# Patient Record
Sex: Male | Born: 1960 | Race: Black or African American | Hispanic: No | Smoking: Former smoker
Health system: Southern US, Community
[De-identification: ages and names within clinical notes are randomized; demographics above are authoritative.]

## PROBLEM LIST (undated history)

## (undated) DIAGNOSIS — J189 Pneumonia, unspecified organism: Secondary | ICD-10-CM

## (undated) DIAGNOSIS — R011 Cardiac murmur, unspecified: Secondary | ICD-10-CM

## (undated) DIAGNOSIS — M5126 Other intervertebral disc displacement, lumbar region: Secondary | ICD-10-CM

## (undated) DIAGNOSIS — E7800 Pure hypercholesterolemia, unspecified: Secondary | ICD-10-CM

## (undated) DIAGNOSIS — I639 Cerebral infarction, unspecified: Secondary | ICD-10-CM

## (undated) DIAGNOSIS — Z531 Procedure and treatment not carried out because of patient's decision for reasons of belief and group pressure: Secondary | ICD-10-CM

## (undated) DIAGNOSIS — E119 Type 2 diabetes mellitus without complications: Secondary | ICD-10-CM

## (undated) DIAGNOSIS — R0602 Shortness of breath: Secondary | ICD-10-CM

## (undated) DIAGNOSIS — T8859XA Other complications of anesthesia, initial encounter: Secondary | ICD-10-CM

## (undated) DIAGNOSIS — G8929 Other chronic pain: Secondary | ICD-10-CM

## (undated) DIAGNOSIS — M1990 Unspecified osteoarthritis, unspecified site: Secondary | ICD-10-CM

## (undated) DIAGNOSIS — IMO0001 Reserved for inherently not codable concepts without codable children: Secondary | ICD-10-CM

## (undated) DIAGNOSIS — K219 Gastro-esophageal reflux disease without esophagitis: Secondary | ICD-10-CM

## (undated) DIAGNOSIS — M545 Low back pain, unspecified: Secondary | ICD-10-CM

## (undated) DIAGNOSIS — I219 Acute myocardial infarction, unspecified: Secondary | ICD-10-CM

## (undated) DIAGNOSIS — T4145XA Adverse effect of unspecified anesthetic, initial encounter: Secondary | ICD-10-CM

---

## 1997-04-28 HISTORY — PX: FOOT SURGERY: SHX648

## 2001-04-28 DIAGNOSIS — I639 Cerebral infarction, unspecified: Secondary | ICD-10-CM

## 2001-04-28 DIAGNOSIS — I219 Acute myocardial infarction, unspecified: Secondary | ICD-10-CM

## 2001-04-28 HISTORY — DX: Acute myocardial infarction, unspecified: I21.9

## 2001-04-28 HISTORY — DX: Cerebral infarction, unspecified: I63.9

## 2003-09-10 ENCOUNTER — Other Ambulatory Visit: Payer: Self-pay

## 2003-12-15 ENCOUNTER — Other Ambulatory Visit: Payer: Self-pay

## 2004-02-12 ENCOUNTER — Ambulatory Visit: Payer: Self-pay

## 2004-05-30 ENCOUNTER — Emergency Department: Payer: Self-pay | Admitting: Emergency Medicine

## 2004-05-30 ENCOUNTER — Other Ambulatory Visit: Payer: Self-pay

## 2004-06-07 ENCOUNTER — Emergency Department: Payer: Self-pay | Admitting: General Practice

## 2004-06-28 ENCOUNTER — Other Ambulatory Visit: Payer: Self-pay

## 2004-06-28 ENCOUNTER — Emergency Department: Payer: Self-pay | Admitting: Emergency Medicine

## 2004-07-03 ENCOUNTER — Ambulatory Visit: Payer: Self-pay | Admitting: Anesthesiology

## 2004-08-15 ENCOUNTER — Ambulatory Visit: Payer: Self-pay | Admitting: Anesthesiology

## 2004-10-02 ENCOUNTER — Ambulatory Visit: Payer: Self-pay | Admitting: Anesthesiology

## 2004-11-14 ENCOUNTER — Ambulatory Visit: Payer: Self-pay | Admitting: Anesthesiology

## 2004-12-18 ENCOUNTER — Ambulatory Visit: Payer: Self-pay | Admitting: Anesthesiology

## 2004-12-22 ENCOUNTER — Other Ambulatory Visit: Payer: Self-pay

## 2004-12-22 ENCOUNTER — Inpatient Hospital Stay: Payer: Self-pay | Admitting: Internal Medicine

## 2005-01-13 ENCOUNTER — Ambulatory Visit: Payer: Self-pay

## 2005-01-15 ENCOUNTER — Ambulatory Visit: Payer: Self-pay | Admitting: Anesthesiology

## 2005-01-28 ENCOUNTER — Ambulatory Visit: Payer: Self-pay | Admitting: Anesthesiology

## 2005-03-11 ENCOUNTER — Ambulatory Visit: Payer: Self-pay | Admitting: Anesthesiology

## 2005-04-11 ENCOUNTER — Ambulatory Visit: Payer: Self-pay | Admitting: Anesthesiology

## 2005-05-30 ENCOUNTER — Ambulatory Visit: Payer: Self-pay | Admitting: Anesthesiology

## 2005-07-15 ENCOUNTER — Ambulatory Visit: Payer: Self-pay | Admitting: Anesthesiology

## 2005-11-02 ENCOUNTER — Emergency Department: Payer: Self-pay | Admitting: Emergency Medicine

## 2005-12-22 ENCOUNTER — Emergency Department: Payer: Self-pay | Admitting: Emergency Medicine

## 2005-12-22 ENCOUNTER — Other Ambulatory Visit: Payer: Self-pay

## 2010-07-22 ENCOUNTER — Ambulatory Visit: Payer: Self-pay | Admitting: Family Medicine

## 2013-02-25 ENCOUNTER — Encounter (HOSPITAL_COMMUNITY)
Admission: EM | Disposition: A | Discharge status: Home / Self Care | Payer: Self-pay | Source: Ambulatory Visit | Attending: Cardiology

## 2013-02-25 ENCOUNTER — Encounter (HOSPITAL_COMMUNITY): Payer: Self-pay | Admitting: General Practice

## 2013-02-25 ENCOUNTER — Ambulatory Visit (HOSPITAL_COMMUNITY)
Admission: EM | Admit: 2013-02-25 | Discharge: 2013-02-26 | Disposition: A | Discharge status: Home / Self Care | Payer: Medicaid Other | Source: Ambulatory Visit | Attending: Cardiology | Admitting: Cardiology

## 2013-02-25 DIAGNOSIS — R110 Nausea: Secondary | ICD-10-CM | POA: Insufficient documentation

## 2013-02-25 DIAGNOSIS — I10 Essential (primary) hypertension: Secondary | ICD-10-CM | POA: Insufficient documentation

## 2013-02-25 DIAGNOSIS — E7800 Pure hypercholesterolemia, unspecified: Secondary | ICD-10-CM | POA: Insufficient documentation

## 2013-02-25 DIAGNOSIS — E119 Type 2 diabetes mellitus without complications: Secondary | ICD-10-CM | POA: Insufficient documentation

## 2013-02-25 DIAGNOSIS — I252 Old myocardial infarction: Secondary | ICD-10-CM | POA: Insufficient documentation

## 2013-02-25 DIAGNOSIS — I2510 Atherosclerotic heart disease of native coronary artery without angina pectoris: Secondary | ICD-10-CM | POA: Insufficient documentation

## 2013-02-25 DIAGNOSIS — R079 Chest pain, unspecified: Secondary | ICD-10-CM | POA: Insufficient documentation

## 2013-02-25 DIAGNOSIS — Z23 Encounter for immunization: Secondary | ICD-10-CM | POA: Insufficient documentation

## 2013-02-25 DIAGNOSIS — Z8673 Personal history of transient ischemic attack (TIA), and cerebral infarction without residual deficits: Secondary | ICD-10-CM | POA: Insufficient documentation

## 2013-02-25 HISTORY — DX: Shortness of breath: R06.02

## 2013-02-25 HISTORY — DX: Cerebral infarction, unspecified: I63.9

## 2013-02-25 HISTORY — DX: Gastro-esophageal reflux disease without esophagitis: K21.9

## 2013-02-25 HISTORY — DX: Acute myocardial infarction, unspecified: I21.9

## 2013-02-25 HISTORY — PX: LEFT HEART CATHETERIZATION WITH CORONARY ANGIOGRAM: SHX5451

## 2013-02-25 HISTORY — PX: CARDIAC CATHETERIZATION: SHX172

## 2013-02-25 HISTORY — DX: Cardiac murmur, unspecified: R01.1

## 2013-02-25 LAB — T4, FREE: Free T4: 1.19 ng/dL (ref 0.80–1.80)

## 2013-02-25 LAB — TROPONIN I
Troponin I: 0.58 ng/mL (ref <0.30)
Troponin I: 0.53 ng/mL (ref <0.30)

## 2013-02-25 LAB — COMPREHENSIVE METABOLIC PANEL
ALT: 10 U/L (ref 0–53)
ALT: 11 U/L (ref 0–53)
AST: 13 U/L (ref 0–37)
AST: 13 U/L (ref 0–37)
Albumin: 3.5 g/dL (ref 3.5–5.2)
Albumin: 3.5 g/dL (ref 3.5–5.2)
Alkaline Phosphatase: 67 U/L (ref 39–117)
Alkaline Phosphatase: 70 U/L (ref 39–117)
BUN: 16 mg/dL (ref 6–23)
CO2: 23 mEq/L (ref 19–32)
Calcium: 9 mg/dL (ref 8.4–10.5)
Chloride: 102 mEq/L (ref 96–112)
Chloride: 104 mEq/L (ref 96–112)
GFR calc Af Amer: 78 mL/min — ABNORMAL LOW (ref >90)
GFR calc non Af Amer: 67 mL/min — ABNORMAL LOW (ref >90)
Glucose, Bld: 128 mg/dL — ABNORMAL HIGH (ref 70–99)
Glucose, Bld: 134 mg/dL — ABNORMAL HIGH (ref 70–99)
Potassium: 3.3 mEq/L — ABNORMAL LOW (ref 3.5–5.1)
Potassium: 3.4 mEq/L — ABNORMAL LOW (ref 3.5–5.1)
Sodium: 138 mEq/L (ref 135–145)
Total Bilirubin: 0.5 mg/dL (ref 0.3–1.2)
Total Bilirubin: 0.4 mg/dL (ref 0.3–1.2)

## 2013-02-25 LAB — CBC
HCT: 36.1 % — ABNORMAL LOW (ref 39.0–52.0)
Hemoglobin: 12.9 g/dL — ABNORMAL LOW (ref 13.0–17.0)
MCH: 29.3 pg (ref 26.0–34.0)
MCHC: 35.7 g/dL (ref 30.0–36.0)
WBC: 7.4 10*3/uL (ref 4.0–10.5)

## 2013-02-25 LAB — LIPID PANEL
HDL: 46 mg/dL (ref >39)
LDL Cholesterol: 86 mg/dL (ref 0–99)
Total CHOL/HDL Ratio: 3.4 RATIO
VLDL: 24 mg/dL (ref 0–40)

## 2013-02-25 LAB — CK TOTAL AND CKMB (NOT AT ARMC)
CK, MB: 1.8 ng/mL (ref 0.3–4.0)
Relative Index: 1.4 (ref 0.0–2.5)

## 2013-02-25 LAB — CBC WITH DIFFERENTIAL/PLATELET
Eosinophils Absolute: 0 10*3/uL (ref 0.0–0.7)
HCT: 37.8 % — ABNORMAL LOW (ref 39.0–52.0)
Hemoglobin: 13.6 g/dL (ref 13.0–17.0)
Lymphs Abs: 2 10*3/uL (ref 0.7–4.0)
MCH: 30 pg (ref 26.0–34.0)
MCHC: 36 g/dL (ref 30.0–36.0)
MCV: 83.4 fL (ref 78.0–100.0)
Monocytes Absolute: 0.5 10*3/uL (ref 0.1–1.0)
Monocytes Relative: 6 % (ref 3–12)
Neutro Abs: 5.4 10*3/uL (ref 1.7–7.7)
Neutrophils Relative %: 68 % (ref 43–77)
RBC: 4.53 MIL/uL (ref 4.22–5.81)
WBC: 7.9 10*3/uL (ref 4.0–10.5)

## 2013-02-25 LAB — PROTIME-INR
INR: 0.94 (ref 0.00–1.49)
Prothrombin Time: 13 seconds (ref 11.6–15.2)

## 2013-02-25 LAB — RAPID URINE DRUG SCREEN, HOSP PERFORMED
Amphetamines: NOT DETECTED
Barbiturates: NOT DETECTED
Opiates: NOT DETECTED
Tetrahydrocannabinol: NOT DETECTED

## 2013-02-25 LAB — GLUCOSE, CAPILLARY
Glucose-Capillary: 142 mg/dL — ABNORMAL HIGH (ref 70–99)
Glucose-Capillary: 76 mg/dL (ref 70–99)
Glucose-Capillary: 128 mg/dL — ABNORMAL HIGH (ref 70–99)

## 2013-02-25 LAB — HEMOGLOBIN A1C: Hgb A1c MFr Bld: 6.1 % — ABNORMAL HIGH (ref <5.7)

## 2013-02-25 LAB — APTT: aPTT: 25 seconds (ref 24–37)

## 2013-02-25 SURGERY — LEFT HEART CATHETERIZATION WITH CORONARY ANGIOGRAM
Anesthesia: LOCAL

## 2013-02-25 MED ORDER — ASPIRIN 81 MG PO CHEW: 324.0000 mg | CHEWABLE_TABLET | ORAL | Status: DC

## 2013-02-25 MED ORDER — NITROGLYCERIN 0.2 MG/ML ON CALL CATH LAB
INTRAVENOUS | Status: AC
  Filled 2013-02-25: qty 1

## 2013-02-25 MED ORDER — ONDANSETRON HCL 4 MG/2ML IJ SOLN: 4.0000 mg | Freq: Four times a day (QID) | INTRAMUSCULAR | Status: DC | PRN

## 2013-02-25 MED ORDER — MIDAZOLAM HCL 2 MG/2ML IJ SOLN
INTRAMUSCULAR | Status: AC
  Filled 2013-02-25: qty 2

## 2013-02-25 MED ORDER — NITROGLYCERIN 0.4 MG SL SUBL: 0.4000 mg | SUBLINGUAL_TABLET | SUBLINGUAL | Status: DC | PRN

## 2013-02-25 MED ORDER — ASPIRIN EC 81 MG PO TBEC
81.0000 mg | DELAYED_RELEASE_TABLET | Freq: Every day | ORAL | Status: DC
  Administered 2013-02-26: 09:00:00 81 mg via ORAL
  Filled 2013-02-25: qty 1

## 2013-02-25 MED ORDER — FENTANYL CITRATE 0.05 MG/ML IJ SOLN
INTRAMUSCULAR | Status: AC
  Filled 2013-02-25: qty 2

## 2013-02-25 MED ORDER — METOPROLOL TARTRATE 12.5 MG HALF TABLET
12.5000 mg | ORAL_TABLET | Freq: Two times a day (BID) | ORAL | Status: DC
  Administered 2013-02-25 – 2013-02-26 (×2): 12.5 mg via ORAL
  Filled 2013-02-25 (×4): qty 1

## 2013-02-25 MED ORDER — INSULIN ASPART 100 UNIT/ML ~~LOC~~ SOLN: 0.0000 [IU] | Freq: Three times a day (TID) | SUBCUTANEOUS | Status: DC

## 2013-02-25 MED ORDER — ASPIRIN 300 MG RE SUPP
300.0000 mg | RECTAL | Status: DC
  Filled 2013-02-25: qty 1

## 2013-02-25 MED ORDER — INFLUENZA VAC SPLIT QUAD 0.5 ML IM SUSP
0.5000 mL | INTRAMUSCULAR | Status: AC
  Administered 2013-02-25: 0.5 mL via INTRAMUSCULAR
  Filled 2013-02-25: qty 0.5

## 2013-02-25 MED ORDER — HEPARIN (PORCINE) IN NACL 2-0.9 UNIT/ML-% IJ SOLN
INTRAMUSCULAR | Status: AC
  Filled 2013-02-25: qty 1000

## 2013-02-25 MED ORDER — ACETAMINOPHEN 325 MG PO TABS
650.0000 mg | ORAL_TABLET | ORAL | Status: DC | PRN
  Administered 2013-02-25: 650 mg via ORAL

## 2013-02-25 MED ORDER — SODIUM CHLORIDE 0.9 % IV SOLN
INTRAVENOUS | Status: AC
  Administered 2013-02-25: 12:00:00 via INTRAVENOUS

## 2013-02-25 MED ORDER — POTASSIUM CHLORIDE CRYS ER 20 MEQ PO TBCR
40.0000 meq | EXTENDED_RELEASE_TABLET | Freq: Once | ORAL | Status: AC
  Administered 2013-02-25: 18:00:00 40 meq via ORAL
  Filled 2013-02-25: qty 2

## 2013-02-25 MED ORDER — ATORVASTATIN CALCIUM 80 MG PO TABS
80.0000 mg | ORAL_TABLET | Freq: Every day | ORAL | Status: DC
  Filled 2013-02-25 (×2): qty 1

## 2013-02-25 MED ORDER — LISINOPRIL 5 MG PO TABS
5.0000 mg | ORAL_TABLET | Freq: Every day | ORAL | Status: DC
  Administered 2013-02-25 – 2013-02-26 (×2): 5 mg via ORAL
  Filled 2013-02-25 (×2): qty 1

## 2013-02-25 MED ORDER — ACETAMINOPHEN 325 MG PO TABS
650.0000 mg | ORAL_TABLET | ORAL | Status: DC | PRN
  Filled 2013-02-25: qty 2

## 2013-02-25 MED ORDER — LIDOCAINE HCL (PF) 1 % IJ SOLN
INTRAMUSCULAR | Status: AC
  Filled 2013-02-25: qty 30

## 2013-02-25 MED ORDER — OXYCODONE-ACETAMINOPHEN 5-325 MG PO TABS: 1.0000 | ORAL_TABLET | ORAL | Status: DC | PRN

## 2013-02-25 NOTE — Progress Notes (Signed)
Chaplain paged to ED for Code Stemi. Patient was taken directly to the Cath Lab per ED. No family members present.   02/25/13 1117  Clinical Encounter Type  Visited With Health care provider  Visit Type Initial;Code;ED  Referral From Nurse

## 2013-02-25 NOTE — H&P (Signed)
Ethan Rosales is an 52 y.o. male.   Chief Complaint: Chest pain HPI: Patient is 52 year old male with past medical history significant for coronary artery disease history of small MI in the past as per patient, history of CVA in 2003, non-insulin-dependent diabetes mellitus, hypercholesteremia, came to [facility] Cath Lab by [facility] as code STEMI  was called. Patient states while at work developed sudden onset of chest pain described as pressure grade 10 over 10 radiating to right arm jaw associated with nausea diaphoresis EKG done on the field showed the normal sinus rhythm with early R-wave progression in anterior leads with early repolarization and minor ST-T wave changes in lateral leads. Patient received aspirin and nitroglycerin with relief of chest pain from 10 over 10 to 1/10. Patient denies any palpitation lightheadedness or syncope. Denies any PND orthopnea leg swelling. Patient states he had cardiac cath done at [facility] and at [facility] and was told to have a small blockages.  No past medical history on file.  No past surgical history on file.  No family history on file. Social History:  has no tobacco, alcohol, and drug history on file.  Allergies:  Allergies  Allergen Reactions  . Morphine And Related   . Penicillins     No prescriptions prior to admission    No results found for this or any previous visit (from the past 48 hour(s)). No results found.  Review of Systems  Constitutional: Negative for fever and chills.  HENT: Negative for hearing loss.   Eyes: Negative for blurred vision and double vision.  Respiratory: Negative for cough, hemoptysis, sputum production and shortness of breath.   Cardiovascular: Positive for chest pain. Negative for palpitations, orthopnea, claudication and leg swelling.  Gastrointestinal: Positive for nausea. Negative for vomiting, abdominal pain and diarrhea.  Genitourinary: Negative for dysuria and urgency.   Neurological: Negative for dizziness and headaches.    Height 6\' 3"  (1.905 m), weight 114.7 kg (252 lb 13.9 oz). Physical Exam  Constitutional: He is oriented to person, place, and time. He appears well-developed and well-nourished.  HENT:  Head: Normocephalic and atraumatic.  Eyes: Left eye exhibits no discharge. No scleral icterus.  Neck: Normal range of motion. Neck supple. No JVD present. No tracheal deviation present. No thyromegaly present.  Cardiovascular: Normal rate, regular rhythm and normal heart sounds.   No murmur heard. Respiratory: Effort normal and breath sounds normal. No respiratory distress. He has no wheezes. He has no rales.  GI: Soft. Bowel sounds are normal. He exhibits no distension.  Musculoskeletal: He exhibits no edema and no tenderness.  Neurological: He is alert and oriented to person, place, and time.     Assessment/Plan Chest pain with minor EKG changes rule out MI Coronary artery disease history of MI in the past Non-insulin-dependent diabetes mellitus History of CVA Hypercholesteremia Plan Discussed with patient briefly in the Cath Lab regarding left cath possible PTCA stenting its risk and benefits and consented for PCI  [facility] N 02/25/2013, 11:59 AM

## 2013-02-25 NOTE — CV Procedure (Signed)
Left cardiac cath report dictated on 02/25/2013 dictation number is [phone]

## 2013-02-26 LAB — LIPID PANEL
HDL: 46 mg/dL (ref >39)
Total CHOL/HDL Ratio: 3.5 RATIO

## 2013-02-26 LAB — CBC
HCT: 38.2 % — ABNORMAL LOW (ref 39.0–52.0)
Hemoglobin: 13.4 g/dL (ref 13.0–17.0)
MCV: 83.8 fL (ref 78.0–100.0)
Platelets: 191 10*3/uL (ref 150–400)
RBC: 4.56 MIL/uL (ref 4.22–5.81)
WBC: 6.6 10*3/uL (ref 4.0–10.5)

## 2013-02-26 LAB — TROPONIN I
Troponin I: <0.3 ng/mL (ref <0.30)
Troponin I: 0.56 ng/mL (ref <0.30)

## 2013-02-26 LAB — BASIC METABOLIC PANEL
BUN: 13 mg/dL (ref 6–23)
CO2: 20 mEq/L (ref 19–32)
Chloride: 105 mEq/L (ref 96–112)
Creatinine, Ser: 1.21 mg/dL (ref 0.50–1.35)
GFR calc non Af Amer: 68 mL/min — ABNORMAL LOW (ref >90)
Glucose, Bld: 93 mg/dL (ref 70–99)

## 2013-02-26 LAB — GLUCOSE, CAPILLARY: Glucose-Capillary: 102 mg/dL — ABNORMAL HIGH (ref 70–99)

## 2013-02-26 MED ORDER — LISINOPRIL 5 MG PO TABS: 5.0000 mg | ORAL_TABLET | Freq: Every day | ORAL | Status: DC

## 2013-02-26 MED ORDER — METOPROLOL TARTRATE 12.5 MG HALF TABLET: 12.5000 mg | ORAL_TABLET | Freq: Two times a day (BID) | ORAL | Status: DC

## 2013-02-26 MED ORDER — NITROGLYCERIN 0.4 MG SL SUBL: 0.4000 mg | SUBLINGUAL_TABLET | SUBLINGUAL | Status: DC | PRN

## 2013-02-26 MED ORDER — METFORMIN HCL 500 MG PO TABS: 2000.0000 mg | ORAL_TABLET | Freq: Every day | ORAL | Status: DC

## 2013-02-26 MED ORDER — LOVASTATIN 40 MG PO TABS: 40.0000 mg | ORAL_TABLET | Freq: Every day | ORAL | Status: DC

## 2013-02-26 NOTE — Discharge Summary (Signed)
  Discharge summary dictated on 02/26/2013 dictation number is [phone]

## 2013-02-26 NOTE — Cardiovascular Report (Signed)
Ethan Rosales, Ethan Rosales             ACCOUNT NO.:  [id number]  MEDICAL RECORD NO.:  [id number]  LOCATION:  6C07C                        FACILITY:  [facility]  PHYSICIAN:  Kamarii Buren N. Sharyn Lull, M.D. DATE OF BIRTH:  03/16/1961  DATE OF PROCEDURE:  02/25/2013 DATE OF DISCHARGE:                           CARDIAC CATHETERIZATION   PROCEDURE:  Left cardiac cath with selective left and right coronary angiography, left ventriculography via right groin using Judkins technique.  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  He came to the [facility] Cath Lab at [facility] as code STEMI was called.  The patient states while at work he developed sudden onset of chest pain described as pressure, grade 10/10, radiating to right arm and jaw associated with nausea and diaphoresis.  EKG done on the field showed normal sinus rhythm with early R-wave progression in the anterior leads with early repolarization changes and minor ST-T wave changes in lateral leads.  The patient received aspirin, sublingual nitro with partial relief of chest pain. Denies any palpitation lightheadedness, or syncope.  Denies PND, orthopnea, or leg swelling.  Denies any history of exertional chest pain.  Denies any drug abuse.  The patient states he had cardiac cath in the past at [facility] and was told to have small blockages.  The patient was discussed about minor EKG changes in the cath lab and left cath, possible PTCA stenting, its risks and benefits, i.e., death, MI, stroke, need for emergency CABG, local vascular complications, and consented for the procedure.  PROCEDURE:  After obtaining the informed consent.  The patient was already in the cath  lab.  Right groin was prepped and draped in usual fashion.  Xylocaine 1% was used for local anesthesia in the right groin. With the help of thin-wall needle, a 6-French arterial sheath was placed.  The sheath was aspirated and flushed.  Next, 6-French left Judkins catheter was advanced over the wire under fluoroscopic guidance up to the ascending aorta.  Wire was pulled out.  The catheter was aspirated and connected to the Manifold.  Catheter was further advanced and engaged into left coronary ostium.  Multiple views of the left system were taken.  Next, catheter was disengaged and was pulled out over the wire and was replaced with 6-French right Judkins catheter, which was advanced over the wire under fluoroscopic guidance up to the ascending aorta.  Wire was pulled out, the catheter was aspirated, and connected to the Manifold.  Catheter was further advanced and engaged into right coronary ostium.  Multiple views of the right system were taken.  Next, catheter was disengaged and was pulled out over the wire and was replaced with 6-French pigtail catheter, which was advanced over the wire under fluoroscopic guidance up to the ascending aorta.  Wire was pulled out, the catheter was aspirated, and connected to the Manifold.  Catheter was further advanced across the aortic valve into the LV.  LV pressures were recorded.  Next, LV graph  was done in 30- degree RAO position.  Post-angiographic pressures were recorded from LV and then pullback pressures were recorded from the aorta.  There was no gradient across the aortic valve.  Next, pigtail catheter was pulled out over the wire.  Sheaths were aspirated and flushed.  FINDINGS:  LV showed good LV systolic function, mild LVH, EF of 60% to 65%.  Left main was patent.  LAD was patent.  Diagonal 1 and 2 were small, which were patent.  Left circumflex was large, which was patent. OM1 and OM2 were small, which were patent.  OM3 and OM4 were  moderate size, which were patent.  RCA was patent.  The patient has left dominant system.  The patient tolerated the procedure well.  There were no complications.  The patient was transferred to recovery room in stable condition.     Eduardo Osier. Sharyn Lull, M.D.     MNH/MEDQ  D:  02/25/2013  T:  02/26/2013  Job:  [id number]

## 2013-02-26 NOTE — Discharge Summary (Signed)
Ethan Rosales, Ethan Rosales             ACCOUNT NO.:  [id number]  MEDICAL RECORD NO.:  [id number]  LOCATION:  6C07C                        FACILITY:  [facility]  PHYSICIAN:  Ethan Rosales, M.D. DATE OF BIRTH:  Sep 20, 1960  DATE OF ADMISSION:  02/25/2013 DATE OF DISCHARGE:  02/26/2013                              DISCHARGE SUMMARY   ADMITTING DIAGNOSES: 1. Chest pain with minor EKG changes rule out myocardial infarction. 2. Coronary artery disease, history of myocardial infarction in the     past. 3. Non-insulin-dependent diabetes mellitus. 4. History of cerebrovascular accident. 5. Hypercholesteremia.  DISCHARGE DIAGNOSES: 1. Status post acute coronary syndrome, status post left cardiac     catheterization. 2. Coronary artery disease, history of myocardial infarction in the     past. 3. Non-insulin-dependent diabetes mellitus. 4. Hypertension. 5. History of cerebrovascular accident. 6. Hypercholesteremia.  DISCHARGE HOME MEDICATIONS: 1. Lisinopril 5 mg 1 tablet daily. 2. Metoprolol tartrate 12.5 mg twice daily. 3. Nitrostat 0.4 mg sublingually use as directed. 4. Lovastatin 40 mg 1 tablet daily. 5. Aspirin 81 mg 1 tablet daily. 6. Metformin 1000 mg twice daily as before starting from February 28, 2013. 7. Omeprazole 40 mg daily. 8. Travatan eyedrops as before.  DIET:  Low-salt, low-cholesterol 1800 calories ADA diet.  The patient has been advised to monitor blood pressure and blood sugar daily. Follow up with me in 1 week.  CONDITION AT DISCHARGE:  Stable.  BRIEF HISTORY AND HOSPITAL COURSE:  [name] is a 52 year old male with past medical history significant for coronary artery disease, history of small myocardial infarction in the past as per the patient, history of cerebrovascular accident in 2003, non-insulin-dependent diabetes mellitus, hypercholesteremia.  He came to [facility] Cath Lab by [facility] as code STEMI was called.  The patient  states while at work, he developed sudden onset of chest pain described as pressure, grade 10/10 radiating to right arm, jaw associated with nausea and diaphoresis.  EKG done on the field showed normal sinus rhythm with early R-wave progression in anterior leads with early repolarization changes and minor ST-T wave changes in lateral leads.  The patient received aspirin and nitroglycerin with relief of chest pain from 10/10 to 1/10 when seen in the cath lab.  The patient denies any palpitation, lightheadedness, or syncope.  Denies PND, orthopnea, or leg swelling. States he had cardiac cath done at [facility] and at [facility] in the past and was told to have small blockages.  The patient did not require any PCI.  Old records are not available.  PHYSICAL EXAMINATION:  GENERAL:  He was alert, awake, oriented x3, in no acute distress.  He was hemodynamically stable. EYES:  Conjunctivae was pink. NECK:  Supple.  No JVD.  No bruit. LUNGS:  Clear to auscultation without rhonchi or rales. CARDIOVASCULAR:  S1, S2 was normal.  There was no S3, gallop.  No murmur or S4 gallop. ABDOMEN:  Soft.  Bowel sounds were present.  Nontender. EXTREMITIES:  There was no clubbing, cyanosis, or edema.  LABORATORY DATA:  Sodium was 137, potassium 3.3.  Repeat potassium this morning 3.7, glucose was 128, BUN 16, creatinine 1.26.  His  first set of troponin-I was 0.58, total CK was normal at 131, MB 1.8.  Repeat troponin-I was 0.53.  Next 2 sets were less than 0.30, this morning it is 0.56 which is minimally elevated.  His cholesterol was 156, triglycerides 116, HDL 46, LDL was 86.  Hemoglobin was 12.9, hematocrit 36.1, white count of 7.4.  The urine drug screen was negative. Hemoglobin A1c was 6.1.  BRIEF HOSPITAL COURSE:  The patient underwent emergent left cardiac cath with selective left and right coronary angiography, and LV graphy as per procedure report.  The patient tolerated the procedure well.   There were no complications.  The patient did not had any significant angiographic stenosis.  Post procedure, the patient did not had any anginal chest pain, complains of occasional GERD symptoms.  His groin is stable with no evidence of hematoma or bruit.  The patient is ambulating in hallway without any problems.  The patient will be discharged home on above medications and will be followed up in my office in 1 week.     Ethan Rosales. Sharyn Rosales, M.D.     MNH/MEDQ  D:  02/26/2013  T:  02/26/2013  Job:  [id number]

## 2013-04-14 ENCOUNTER — Encounter (HOSPITAL_COMMUNITY): Payer: Self-pay | Admitting: Emergency Medicine

## 2013-04-14 ENCOUNTER — Emergency Department (HOSPITAL_COMMUNITY)
Admission: EM | Admit: 2013-04-14 | Discharge: 2013-04-14 | Disposition: A | Discharge status: Home / Self Care | Payer: No Typology Code available for payment source | Attending: Emergency Medicine | Admitting: Emergency Medicine

## 2013-04-14 DIAGNOSIS — R011 Cardiac murmur, unspecified: Secondary | ICD-10-CM | POA: Insufficient documentation

## 2013-04-14 DIAGNOSIS — Y92410 Unspecified street and highway as the place of occurrence of the external cause: Secondary | ICD-10-CM | POA: Insufficient documentation

## 2013-04-14 DIAGNOSIS — Z880 Allergy status to penicillin: Secondary | ICD-10-CM | POA: Diagnosis not present

## 2013-04-14 DIAGNOSIS — Z95818 Presence of other cardiac implants and grafts: Secondary | ICD-10-CM | POA: Insufficient documentation

## 2013-04-14 DIAGNOSIS — Z79899 Other long term (current) drug therapy: Secondary | ICD-10-CM | POA: Diagnosis not present

## 2013-04-14 DIAGNOSIS — Z87891 Personal history of nicotine dependence: Secondary | ICD-10-CM | POA: Diagnosis not present

## 2013-04-14 DIAGNOSIS — S0990XA Unspecified injury of head, initial encounter: Secondary | ICD-10-CM | POA: Diagnosis present

## 2013-04-14 DIAGNOSIS — Z8673 Personal history of transient ischemic attack (TIA), and cerebral infarction without residual deficits: Secondary | ICD-10-CM | POA: Diagnosis not present

## 2013-04-14 DIAGNOSIS — E119 Type 2 diabetes mellitus without complications: Secondary | ICD-10-CM | POA: Diagnosis not present

## 2013-04-14 DIAGNOSIS — K219 Gastro-esophageal reflux disease without esophagitis: Secondary | ICD-10-CM | POA: Diagnosis not present

## 2013-04-14 DIAGNOSIS — M542 Cervicalgia: Secondary | ICD-10-CM

## 2013-04-14 DIAGNOSIS — Z7982 Long term (current) use of aspirin: Secondary | ICD-10-CM | POA: Diagnosis not present

## 2013-04-14 DIAGNOSIS — S0993XA Unspecified injury of face, initial encounter: Secondary | ICD-10-CM | POA: Insufficient documentation

## 2013-04-14 DIAGNOSIS — I252 Old myocardial infarction: Secondary | ICD-10-CM | POA: Insufficient documentation

## 2013-04-14 DIAGNOSIS — Y9389 Activity, other specified: Secondary | ICD-10-CM | POA: Diagnosis not present

## 2013-04-14 MED ORDER — IBUPROFEN 800 MG PO TABS: 800.0000 mg | ORAL_TABLET | Freq: Three times a day (TID) | ORAL | Status: DC

## 2013-04-14 MED ORDER — METHOCARBAMOL 500 MG PO TABS: 500.0000 mg | ORAL_TABLET | Freq: Two times a day (BID) | ORAL | Status: DC | PRN

## 2013-04-14 MED ORDER — IBUPROFEN 400 MG PO TABS
800.0000 mg | ORAL_TABLET | Freq: Once | ORAL | Status: AC
  Administered 2013-04-14: 800 mg via ORAL
  Filled 2013-04-14: qty 2
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
## 2013-04-16 NOTE — ED Provider Notes (Signed)
Medical screening examination/treatment/procedure(s) were performed by non-physician practitioner and as supervising physician I was immediately available for consultation/collaboration.  EKG Interpretation   None         [name]. Sheddrick Lattanzio, MD 04/16/13 1630

## 2013-12-10 ENCOUNTER — Encounter (HOSPITAL_COMMUNITY): Payer: Self-pay | Admitting: Emergency Medicine

## 2013-12-10 ENCOUNTER — Emergency Department (HOSPITAL_COMMUNITY)
Admission: EM | Admit: 2013-12-10 | Discharge: 2013-12-10 | Disposition: A | Discharge status: Home / Self Care | Payer: Commercial Managed Care - PPO | Attending: Emergency Medicine | Admitting: Emergency Medicine

## 2013-12-10 ENCOUNTER — Emergency Department (HOSPITAL_COMMUNITY): Payer: Commercial Managed Care - PPO

## 2013-12-10 DIAGNOSIS — M545 Low back pain, unspecified: Secondary | ICD-10-CM

## 2013-12-10 DIAGNOSIS — S0993XA Unspecified injury of face, initial encounter: Secondary | ICD-10-CM | POA: Diagnosis not present

## 2013-12-10 DIAGNOSIS — Y9389 Activity, other specified: Secondary | ICD-10-CM | POA: Diagnosis not present

## 2013-12-10 DIAGNOSIS — Z79899 Other long term (current) drug therapy: Secondary | ICD-10-CM | POA: Diagnosis not present

## 2013-12-10 DIAGNOSIS — S199XXA Unspecified injury of neck, initial encounter: Secondary | ICD-10-CM | POA: Diagnosis present

## 2013-12-10 DIAGNOSIS — Y92410 Unspecified street and highway as the place of occurrence of the external cause: Secondary | ICD-10-CM | POA: Insufficient documentation

## 2013-12-10 DIAGNOSIS — K219 Gastro-esophageal reflux disease without esophagitis: Secondary | ICD-10-CM | POA: Diagnosis not present

## 2013-12-10 DIAGNOSIS — E7800 Pure hypercholesterolemia, unspecified: Secondary | ICD-10-CM | POA: Diagnosis not present

## 2013-12-10 DIAGNOSIS — Z7982 Long term (current) use of aspirin: Secondary | ICD-10-CM | POA: Diagnosis not present

## 2013-12-10 DIAGNOSIS — IMO0002 Reserved for concepts with insufficient information to code with codable children: Secondary | ICD-10-CM | POA: Diagnosis not present

## 2013-12-10 DIAGNOSIS — Z880 Allergy status to penicillin: Secondary | ICD-10-CM | POA: Insufficient documentation

## 2013-12-10 DIAGNOSIS — M542 Cervicalgia: Secondary | ICD-10-CM

## 2013-12-10 HISTORY — DX: Pure hypercholesterolemia, unspecified: E78.00

## 2013-12-10 HISTORY — DX: Gastro-esophageal reflux disease without esophagitis: K21.9

## 2013-12-10 MED ORDER — CYCLOBENZAPRINE HCL 5 MG PO TABS: 10.0000 mg | ORAL_TABLET | Freq: Two times a day (BID) | ORAL | Status: DC | PRN

## 2013-12-10 MED ORDER — HYDROCODONE-ACETAMINOPHEN 5-325 MG PO TABS: 1.0000 | ORAL_TABLET | Freq: Four times a day (QID) | ORAL | Status: DC | PRN

## 2013-12-10 NOTE — ED Notes (Signed)
Pt remains in Xray  
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
## 2013-12-10 NOTE — Discharge Instructions (Signed)
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
## 2013-12-11 NOTE — ED Provider Notes (Signed)
Medical screening examination/treatment/procedure(s) were performed by non-physician practitioner and as supervising physician I was immediately available for consultation/collaboration.  Anglea Gordner T Tam Delisle, MD 12/11/13 1258 

## 2013-12-28 ENCOUNTER — Encounter (HOSPITAL_COMMUNITY): Payer: Self-pay | Admitting: Emergency Medicine

## 2014-04-06 ENCOUNTER — Encounter (HOSPITAL_COMMUNITY): Payer: Self-pay | Admitting: Cardiology

## 2014-07-11 ENCOUNTER — Emergency Department (HOSPITAL_COMMUNITY): Payer: Commercial Managed Care - PPO

## 2014-07-11 ENCOUNTER — Emergency Department (HOSPITAL_COMMUNITY)
Admission: EM | Admit: 2014-07-11 | Discharge: 2014-07-11 | Disposition: A | Discharge status: Home / Self Care | Payer: Commercial Managed Care - PPO | Attending: Emergency Medicine | Admitting: Emergency Medicine

## 2014-07-11 ENCOUNTER — Encounter (HOSPITAL_COMMUNITY): Payer: Self-pay | Admitting: Emergency Medicine

## 2014-07-11 DIAGNOSIS — K219 Gastro-esophageal reflux disease without esophagitis: Secondary | ICD-10-CM | POA: Insufficient documentation

## 2014-07-11 DIAGNOSIS — Z8673 Personal history of transient ischemic attack (TIA), and cerebral infarction without residual deficits: Secondary | ICD-10-CM | POA: Insufficient documentation

## 2014-07-11 DIAGNOSIS — I252 Old myocardial infarction: Secondary | ICD-10-CM | POA: Diagnosis not present

## 2014-07-11 DIAGNOSIS — B349 Viral infection, unspecified: Secondary | ICD-10-CM | POA: Diagnosis not present

## 2014-07-11 DIAGNOSIS — E119 Type 2 diabetes mellitus without complications: Secondary | ICD-10-CM | POA: Insufficient documentation

## 2014-07-11 DIAGNOSIS — R69 Illness, unspecified: Secondary | ICD-10-CM

## 2014-07-11 DIAGNOSIS — Z880 Allergy status to penicillin: Secondary | ICD-10-CM | POA: Insufficient documentation

## 2014-07-11 DIAGNOSIS — R011 Cardiac murmur, unspecified: Secondary | ICD-10-CM | POA: Diagnosis not present

## 2014-07-11 DIAGNOSIS — Z7982 Long term (current) use of aspirin: Secondary | ICD-10-CM | POA: Diagnosis not present

## 2014-07-11 DIAGNOSIS — R000 Tachycardia, unspecified: Secondary | ICD-10-CM | POA: Insufficient documentation

## 2014-07-11 DIAGNOSIS — R52 Pain, unspecified: Secondary | ICD-10-CM | POA: Diagnosis present

## 2014-07-11 DIAGNOSIS — E780 Pure hypercholesterolemia: Secondary | ICD-10-CM | POA: Insufficient documentation

## 2014-07-11 DIAGNOSIS — J111 Influenza due to unidentified influenza virus with other respiratory manifestations: Secondary | ICD-10-CM | POA: Diagnosis not present

## 2014-07-11 DIAGNOSIS — Z9889 Other specified postprocedural states: Secondary | ICD-10-CM | POA: Diagnosis not present

## 2014-07-11 DIAGNOSIS — Z79899 Other long term (current) drug therapy: Secondary | ICD-10-CM | POA: Insufficient documentation

## 2014-07-11 LAB — CBC WITH DIFFERENTIAL/PLATELET
Basophils Absolute: 0 10*3/uL (ref 0.0–0.1)
Basophils Relative: 0 % (ref 0–1)
Eosinophils Absolute: 0 10*3/uL (ref 0.0–0.7)
Eosinophils Relative: 0 % (ref 0–5)
HEMATOCRIT: 41.2 % (ref 39.0–52.0)
Hemoglobin: 14.3 g/dL (ref 13.0–17.0)
LYMPHS PCT: 8 % — AB (ref 12–46)
Lymphs Abs: 0.7 10*3/uL (ref 0.7–4.0)
MCH: 29 pg (ref 26.0–34.0)
MCHC: 34.7 g/dL (ref 30.0–36.0)
MCV: 83.6 fL (ref 78.0–100.0)
MONO ABS: 0.6 10*3/uL (ref 0.1–1.0)
Monocytes Relative: 7 % (ref 3–12)
NEUTROS ABS: 7.6 10*3/uL (ref 1.7–7.7)
Neutrophils Relative %: 85 % — ABNORMAL HIGH (ref 43–77)
Platelets: 187 10*3/uL (ref 150–400)
RBC: 4.93 MIL/uL (ref 4.22–5.81)
RDW: 12.9 % (ref 11.5–15.5)
WBC: 8.9 10*3/uL (ref 4.0–10.5)

## 2014-07-11 LAB — BASIC METABOLIC PANEL
ANION GAP: 7 (ref 5–15)
BUN: 14 mg/dL (ref 6–23)
CHLORIDE: 104 mmol/L (ref 96–112)
CO2: 25 mmol/L (ref 19–32)
CREATININE: 1.45 mg/dL — AB (ref 0.50–1.35)
Calcium: 9.6 mg/dL (ref 8.4–10.5)
GFR calc non Af Amer: 54 mL/min — ABNORMAL LOW (ref >90)
GFR, EST AFRICAN AMERICAN: 63 mL/min — AB (ref >90)
Glucose, Bld: 130 mg/dL — ABNORMAL HIGH (ref 70–99)
Potassium: 3.3 mmol/L — ABNORMAL LOW (ref 3.5–5.1)
Sodium: 136 mmol/L (ref 135–145)

## 2014-07-11 LAB — I-STAT TROPONIN, ED: TROPONIN I, POC: 0 ng/mL (ref 0.00–0.08)

## 2014-07-11 LAB — BRAIN NATRIURETIC PEPTIDE: B Natriuretic Peptide: 18.5 pg/mL (ref 0.0–100.0)

## 2014-07-11 LAB — I-STAT CG4 LACTIC ACID, ED: Lactic Acid, Venous: 1.44 mmol/L (ref 0.5–2.0)

## 2014-07-11 MED ORDER — SODIUM CHLORIDE 0.9 % IV BOLUS (SEPSIS)
1000.0000 mL | Freq: Once | INTRAVENOUS | Status: AC
  Administered 2014-07-11: 1000 mL via INTRAVENOUS

## 2014-07-11 MED ORDER — KETOROLAC TROMETHAMINE 30 MG/ML IJ SOLN
30.0000 mg | Freq: Once | INTRAMUSCULAR | Status: AC
  Administered 2014-07-11: 30 mg via INTRAVENOUS
  Filled 2014-07-11: qty 1

## 2014-07-11 NOTE — ED Notes (Signed)
Pt has been sipping water with no difficulties x 1 hour.

## 2014-07-11 NOTE — ED Notes (Signed)
Pt c/o body aches, cough and SOB; pt hyperventilating at present; pt sts not feeling well x 3 days; pt sts some fever

## 2014-07-11 NOTE — ED Notes (Signed)
Pt reports sudden onset of fever/chills and fatigue with cough starting today.

## 2014-07-11 NOTE — ED Provider Notes (Signed)
CSN: [id number]     Arrival date & time 07/11/14  1453 History   First MD Initiated Contact with Patient 07/11/14 1827     Chief Complaint  Patient presents with  . Generalized Body Aches  . Cough  . Shortness of Breath     (Consider location/radiation/quality/duration/timing/severity/associated sxs/prior Treatment) HPI  Pt presenting with c/o diffuse body aches, nonproductive cough and subjective fever.  Pt states symptoms began early this morning.  Per triage he has been feeling bad for 3 days, however patient states to me that his symptoms started this morning.  No vomiting or change in stools.  No abdominal pain.  No chest pain.  Pt has not ahd any treatment prior to arrival for his symptoms.  He states he cannot take tamiflu as it gives him palpitations.  No sick contacts or recent travel.  Did not get his flu shot this year.  There are no other associated systemic symptoms, there are no other alleviating or modifying factors.   Past Medical History  Diagnosis Date  . Myocardial infarction 2003  . Heart murmur   . Shortness of breath     ' AT TIMES "  . Diabetes mellitus without complication     TYPE 2  . Stroke 2003    LEFT SIDE RESIDUAL PER PATIENT  . GERD (gastroesophageal reflux disease)   . High cholesterol   . Acid reflux    Past Surgical History  Procedure Laterality Date  . Cardiac catheterization  02/25/2013  . Left heart catheterization with coronary angiogram N/A 02/25/2013    Procedure: LEFT HEART CATHETERIZATION WITH CORONARY ANGIOGRAM;  Surgeon: Robynn Pane, MD;  Location: [facility] CATH LAB;  Service: Cardiovascular;  Laterality: N/A;   History reviewed. No pertinent family history. History  Substance Use Topics  . Smoking status: Never Smoker   . Smokeless tobacco: Not on file  . Alcohol Use: No    Review of Systems  ROS reviewed and all otherwise negative except for mentioned in HPI    Allergies  Hydromorphone; Morphine and related; Tamiflu;  Codeine; and Penicillins  Home Medications   Prior to Admission medications   Medication Sig Start Date End Date Taking? Authorizing Provider  aspirin 81 MG tablet Take 81 mg by mouth daily.   Yes Historical Provider, MD  cyclobenzaprine (FLEXERIL) 5 MG tablet Take 2 tablets (10 mg total) by mouth 2 (two) times daily as needed for muscle spasms. 12/10/13  Yes Teressa Lower, NP  gabapentin (NEURONTIN) 100 MG capsule Take 300 mg by mouth 3 (three) times daily.   Yes Historical Provider, MD  lovastatin (MEVACOR) 40 MG tablet Take 1 tablet (40 mg total) by mouth daily. 02/26/13  Yes Rinaldo Cloud, MD  metoprolol tartrate (LOPRESSOR) 12.5 mg TABS tablet Take 0.5 tablets (12.5 mg total) by mouth 2 (two) times daily. 02/26/13  Yes Rinaldo Cloud, MD  nitroGLYCERIN (NITROSTAT) 0.4 MG SL tablet Place 1 tablet (0.4 mg total) under the tongue every 5 (five) minutes x 3 doses as needed for chest pain. 02/26/13  Yes Rinaldo Cloud, MD  omeprazole (PRILOSEC) 20 MG capsule Take 20 mg by mouth daily.   Yes Historical Provider, MD  Travoprost, BAK Free, (TRAVATAN) 0.004 % SOLN ophthalmic solution Place 1 drop into both eyes at bedtime. 11/16/13  Yes Historical Provider, MD  traZODone (DESYREL) 50 MG tablet Take 25-100 mg by mouth at bedtime.   Yes Historical Provider, MD  HYDROcodone-acetaminophen (NORCO/VICODIN) 5-325 MG per tablet Take 1-2 tablets by mouth  every 6 (six) hours as needed. 12/10/13   Teressa Lower, NP  lisinopril (PRINIVIL,ZESTRIL) 5 MG tablet Take 1 tablet (5 mg total) by mouth daily. 02/26/13   Rinaldo Cloud, MD  metFORMIN (GLUCOPHAGE) 500 MG tablet Take 4 tablets (2,000 mg total) by mouth daily. 02/28/13   Rinaldo Cloud, MD  methocarbamol (ROBAXIN) 500 MG tablet Take 1 tablet (500 mg total) by mouth 2 (two) times daily as needed. 04/14/13   Garlon Hatchet, PA-C  Travoprost, BAK Free, (TRAVATAN) 0.004 % SOLN ophthalmic solution Place 1 drop into both eyes at bedtime.    Historical Provider, MD   BP  113/39 mmHg  Pulse 97  Temp(Src) 99.4 F (37.4 C) (Oral)  Resp 18  SpO2 99%  Vitals reviewed Physical Exam  Physical Examination: General appearance - alert, well appearing, and in no distress Mental status - alert, oriented to person, place, and time Eyes - no conjunctival injection, no scleral icterus Mouth - mucous membranes moist, pharynx normal without lesions Chest - clear to auscultation, no wheezes, rales or rhonchi, symmetric air entry, normal respiratory effort Heart - tachycardic rate, regular rhythm, normal S1, S2, no murmurs, rubs, clicks or gallops Abdomen - soft, nontender, nondistended, no masses or organomegaly Extremities - peripheral pulses normal, no pedal edema, no clubbing or cyanosis Skin - normal coloration and turgor, no rashes  ED Course  Procedures (including critical care time) Labs Review Labs Reviewed  BASIC METABOLIC PANEL - Abnormal; Notable for the following:    Potassium 3.3 (*)    Glucose, Bld 130 (*)    Creatinine, Ser 1.45 (*)    GFR calc non Af Amer 54 (*)    GFR calc Af Amer 63 (*)    All other components within normal limits  CBC WITH DIFFERENTIAL/PLATELET - Abnormal; Notable for the following:    Neutrophils Relative % 85 (*)    Lymphocytes Relative 8 (*)    All other components within normal limits  BRAIN NATRIURETIC PEPTIDE  I-STAT TROPOININ, ED  I-STAT CG4 LACTIC ACID, ED  I-STAT CG4 LACTIC ACID, ED    Imaging Review Dg Chest 2 View  07/11/2014   CLINICAL DATA:  Shortness of breath with cough and fever for 3 days  EXAM: CHEST  2 VIEW  COMPARISON:  None.  FINDINGS: There is mild atelectatic change in the left base. Lungs elsewhere clear. Heart size and pulmonary vascularity are normal. No adenopathy. No bone lesions.  IMPRESSION: Left base atelectasis.  No edema or consolidation.   Electronically Signed   By: Bretta Bang III M.D.   On: 07/11/2014 16:15     EKG Interpretation   Date/Time:  Tuesday July 11 2014 15:00:09  EDT Ventricular Rate:  103 PR Interval:  152 QRS Duration: 76 QT Interval:  318 QTC Calculation: 416 R Axis:   29 Text Interpretation:  Sinus tachycardia Otherwise normal ECG Since  previous tracing rate faster Confirmed by [facility]  MD, MARTHA [phone]) on  07/11/2014 6:29:17 PM      MDM   Final diagnoses:  Viral infection  Influenza-like illness    Pt presenting with cough, subjective fever, diffuse body aches which began this morning.  Pt is tachycardic on arrival - he was treated with toradol and IV fluids.  He has multiple other allergies to pain meds- so although he was still having pain was not able to give another med- we discussed this and he verbalized understanding.  Suspect viral infection/influenza like illness.  Due to acute onset  would be a candidate for tamilfu, although patient declined this as well due to having palpitations as a  Side effect in the past.  Discharged with strict return precautions.  Pt agreeable with plan.    Jerelyn ScottMartha Linker, MD 07/12/14 [phone]

## 2014-07-11 NOTE — Discharge Instructions (Signed)
Return to the ED with any concerns including difficulty breathing, vomiting and not able to keep down liquids, chest pain, fainting, decreased level of alertness/lethargy, or any other alarming symptoms ° ° °

## 2014-09-12 ENCOUNTER — Observation Stay (HOSPITAL_COMMUNITY)
Admission: EM | Admit: 2014-09-12 | Discharge: 2014-09-13 | Disposition: A | Discharge status: Home / Self Care | Payer: Commercial Managed Care - PPO | Attending: Cardiology | Admitting: Cardiology

## 2014-09-12 ENCOUNTER — Emergency Department (HOSPITAL_COMMUNITY): Payer: Commercial Managed Care - PPO

## 2014-09-12 ENCOUNTER — Ambulatory Visit (HOSPITAL_COMMUNITY): Admission: EM | Admit: 2014-09-12 | Payer: Self-pay | Admitting: Interventional Cardiology

## 2014-09-12 ENCOUNTER — Encounter (HOSPITAL_COMMUNITY)
Admission: EM | Disposition: A | Discharge status: Home / Self Care | Payer: Self-pay | Source: Home / Self Care | Attending: Emergency Medicine

## 2014-09-12 ENCOUNTER — Encounter (HOSPITAL_COMMUNITY): Payer: Self-pay | Admitting: Neurology

## 2014-09-12 DIAGNOSIS — Z880 Allergy status to penicillin: Secondary | ICD-10-CM | POA: Insufficient documentation

## 2014-09-12 DIAGNOSIS — R079 Chest pain, unspecified: Secondary | ICD-10-CM | POA: Diagnosis present

## 2014-09-12 DIAGNOSIS — Z9889 Other specified postprocedural states: Secondary | ICD-10-CM | POA: Insufficient documentation

## 2014-09-12 DIAGNOSIS — R011 Cardiac murmur, unspecified: Secondary | ICD-10-CM | POA: Diagnosis not present

## 2014-09-12 DIAGNOSIS — E119 Type 2 diabetes mellitus without complications: Secondary | ICD-10-CM | POA: Insufficient documentation

## 2014-09-12 DIAGNOSIS — Z79899 Other long term (current) drug therapy: Secondary | ICD-10-CM | POA: Diagnosis not present

## 2014-09-12 DIAGNOSIS — E780 Pure hypercholesterolemia: Secondary | ICD-10-CM | POA: Diagnosis not present

## 2014-09-12 DIAGNOSIS — I252 Old myocardial infarction: Secondary | ICD-10-CM | POA: Diagnosis not present

## 2014-09-12 DIAGNOSIS — Z8673 Personal history of transient ischemic attack (TIA), and cerebral infarction without residual deficits: Secondary | ICD-10-CM | POA: Insufficient documentation

## 2014-09-12 DIAGNOSIS — Z7982 Long term (current) use of aspirin: Secondary | ICD-10-CM | POA: Diagnosis not present

## 2014-09-12 DIAGNOSIS — K219 Gastro-esophageal reflux disease without esophagitis: Secondary | ICD-10-CM | POA: Insufficient documentation

## 2014-09-12 HISTORY — DX: Procedure and treatment not carried out because of patient's decision for reasons of belief and group pressure: Z53.1

## 2014-09-12 HISTORY — DX: Other complications of anesthesia, initial encounter: T88.59XA

## 2014-09-12 HISTORY — DX: Low back pain: M54.5

## 2014-09-12 HISTORY — DX: Type 2 diabetes mellitus without complications: E11.9

## 2014-09-12 HISTORY — DX: Adverse effect of unspecified anesthetic, initial encounter: T41.45XA

## 2014-09-12 HISTORY — DX: Unspecified osteoarthritis, unspecified site: M19.90

## 2014-09-12 HISTORY — DX: Other chronic pain: G89.29

## 2014-09-12 HISTORY — DX: Pneumonia, unspecified organism: J18.9

## 2014-09-12 HISTORY — DX: Low back pain, unspecified: M54.50

## 2014-09-12 HISTORY — DX: Reserved for inherently not codable concepts without codable children: IMO0001

## 2014-09-12 HISTORY — DX: Other intervertebral disc displacement, lumbar region: M51.26

## 2014-09-12 LAB — COMPREHENSIVE METABOLIC PANEL
ALK PHOS: 60 U/L (ref 38–126)
ALK PHOS: 55 U/L (ref 38–126)
ALT: 27 U/L (ref 17–63)
ALT: 24 U/L (ref 17–63)
AST: 18 U/L (ref 15–41)
AST: 17 U/L (ref 15–41)
Albumin: 3.8 g/dL (ref 3.5–5.0)
Albumin: 3.5 g/dL (ref 3.5–5.0)
Anion gap: 8 (ref 5–15)
Anion gap: 8 (ref 5–15)
BILIRUBIN TOTAL: 0.6 mg/dL (ref 0.3–1.2)
BUN: 20 mg/dL (ref 6–20)
BUN: 17 mg/dL (ref 6–20)
CALCIUM: 8.9 mg/dL (ref 8.9–10.3)
CHLORIDE: 104 mmol/L (ref 101–111)
CO2: 27 mmol/L (ref 22–32)
CO2: 22 mmol/L (ref 22–32)
CREATININE: 1.33 mg/dL — AB (ref 0.61–1.24)
Calcium: 9.2 mg/dL (ref 8.9–10.3)
Chloride: 106 mmol/L (ref 101–111)
Creatinine, Ser: 1.58 mg/dL — ABNORMAL HIGH (ref 0.61–1.24)
GFR calc Af Amer: 56 mL/min — ABNORMAL LOW (ref >60)
GFR calc non Af Amer: 60 mL/min — ABNORMAL LOW (ref >60)
GFR, EST NON AFRICAN AMERICAN: 49 mL/min — AB (ref >60)
GLUCOSE: 104 mg/dL — AB (ref 65–99)
Glucose, Bld: 118 mg/dL — ABNORMAL HIGH (ref 65–99)
POTASSIUM: 3.7 mmol/L (ref 3.5–5.1)
Potassium: 3.8 mmol/L (ref 3.5–5.1)
SODIUM: 136 mmol/L (ref 135–145)
Sodium: 139 mmol/L (ref 135–145)
Total Bilirubin: 0.7 mg/dL (ref 0.3–1.2)
Total Protein: 6.9 g/dL (ref 6.5–8.1)
Total Protein: 6.5 g/dL (ref 6.5–8.1)

## 2014-09-12 LAB — I-STAT TROPONIN, ED: Troponin i, poc: 0 ng/mL (ref 0.00–0.08)

## 2014-09-12 LAB — CBC WITH DIFFERENTIAL/PLATELET
Basophils Absolute: 0 10*3/uL (ref 0.0–0.1)
Basophils Relative: 0 % (ref 0–1)
Eosinophils Absolute: 0.1 10*3/uL (ref 0.0–0.7)
Eosinophils Relative: 1 % (ref 0–5)
HEMATOCRIT: 39.2 % (ref 39.0–52.0)
HEMOGLOBIN: 13.5 g/dL (ref 13.0–17.0)
LYMPHS PCT: 33 % (ref 12–46)
Lymphs Abs: 2.6 10*3/uL (ref 0.7–4.0)
MCH: 28.9 pg (ref 26.0–34.0)
MCHC: 34.4 g/dL (ref 30.0–36.0)
MCV: 83.9 fL (ref 78.0–100.0)
MONOS PCT: 6 % (ref 3–12)
Monocytes Absolute: 0.5 10*3/uL (ref 0.1–1.0)
NEUTROS ABS: 4.6 10*3/uL (ref 1.7–7.7)
Neutrophils Relative %: 60 % (ref 43–77)
Platelets: 177 10*3/uL (ref 150–400)
RBC: 4.67 MIL/uL (ref 4.22–5.81)
RDW: 13.5 % (ref 11.5–15.5)
WBC: 7.8 10*3/uL (ref 4.0–10.5)

## 2014-09-12 LAB — POCT I-STAT TROPONIN I: TROPONIN I, POC: 0 ng/mL (ref 0.00–0.08)

## 2014-09-12 LAB — APTT: aPTT: 25 seconds (ref 24–37)

## 2014-09-12 LAB — CBC
HCT: 40.8 % (ref 39.0–52.0)
Hemoglobin: 14 g/dL (ref 13.0–17.0)
MCH: 29.1 pg (ref 26.0–34.0)
MCHC: 34.3 g/dL (ref 30.0–36.0)
MCV: 84.8 fL (ref 78.0–100.0)
Platelets: 200 10*3/uL (ref 150–400)
RBC: 4.81 MIL/uL (ref 4.22–5.81)
RDW: 13.5 % (ref 11.5–15.5)
WBC: 8 10*3/uL (ref 4.0–10.5)

## 2014-09-12 LAB — PROTIME-INR
INR: 1.01 (ref 0.00–1.49)
PROTHROMBIN TIME: 13.4 s (ref 11.6–15.2)

## 2014-09-12 LAB — GLUCOSE, CAPILLARY
GLUCOSE-CAPILLARY: 83 mg/dL (ref 65–99)
GLUCOSE-CAPILLARY: 113 mg/dL — AB (ref 65–99)
Glucose-Capillary: 109 mg/dL — ABNORMAL HIGH (ref 65–99)

## 2014-09-12 LAB — HEPARIN LEVEL (UNFRACTIONATED): HEPARIN UNFRACTIONATED: 0.92 [IU]/mL — AB (ref 0.30–0.70)

## 2014-09-12 SURGERY — LEFT HEART CATH AND CORONARY ANGIOGRAPHY

## 2014-09-12 MED ORDER — HEPARIN BOLUS VIA INFUSION
4000.0000 [IU] | Freq: Once | INTRAVENOUS | Status: AC
  Administered 2014-09-12: 4000 [IU] via INTRAVENOUS
  Filled 2014-09-12: qty 4000

## 2014-09-12 MED ORDER — NITROGLYCERIN 0.4 MG SL SUBL: 0.4000 mg | SUBLINGUAL_TABLET | SUBLINGUAL | Status: DC | PRN

## 2014-09-12 MED ORDER — INSULIN ASPART 100 UNIT/ML ~~LOC~~ SOLN: 0.0000 [IU] | Freq: Three times a day (TID) | SUBCUTANEOUS | Status: DC

## 2014-09-12 MED ORDER — ASPIRIN 81 MG PO CHEW
324.0000 mg | CHEWABLE_TABLET | ORAL | Status: AC
  Administered 2014-09-12: 324 mg via ORAL
  Filled 2014-09-12: qty 4

## 2014-09-12 MED ORDER — HEPARIN (PORCINE) IN NACL 100-0.45 UNIT/ML-% IJ SOLN
1550.0000 [IU]/h | INTRAMUSCULAR | Status: DC
  Administered 2014-09-12: 1550 [IU]/h via INTRAVENOUS
  Filled 2014-09-12: qty 250

## 2014-09-12 MED ORDER — ACETAMINOPHEN 325 MG PO TABS: 650.0000 mg | ORAL_TABLET | ORAL | Status: DC | PRN

## 2014-09-12 MED ORDER — FENTANYL CITRATE (PF) 100 MCG/2ML IJ SOLN
75.0000 ug | Freq: Once | INTRAMUSCULAR | Status: AC
  Administered 2014-09-12: 75 ug via INTRAVENOUS
  Filled 2014-09-12: qty 2

## 2014-09-12 MED ORDER — NITROGLYCERIN 2 % TD OINT
1.0000 [in_us] | TOPICAL_OINTMENT | Freq: Once | TRANSDERMAL | Status: AC
  Administered 2014-09-12: 1 [in_us] via TOPICAL
  Filled 2014-09-12: qty 1

## 2014-09-12 MED ORDER — PANTOPRAZOLE SODIUM 40 MG PO TBEC
40.0000 mg | DELAYED_RELEASE_TABLET | Freq: Every day | ORAL | Status: DC
  Administered 2014-09-12 – 2014-09-13 (×2): 40 mg via ORAL
  Filled 2014-09-12 (×2): qty 1

## 2014-09-12 MED ORDER — SODIUM CHLORIDE 0.9 % IV SOLN
INTRAVENOUS | Status: DC
  Administered 2014-09-12 – 2014-09-13 (×2): via INTRAVENOUS

## 2014-09-12 MED ORDER — ASPIRIN 81 MG PO CHEW
324.0000 mg | CHEWABLE_TABLET | Freq: Once | ORAL | Status: DC
  Filled 2014-09-12: qty 4

## 2014-09-12 MED ORDER — NITROGLYCERIN 0.4 MG SL SUBL
0.4000 mg | SUBLINGUAL_TABLET | SUBLINGUAL | Status: DC | PRN
Start: 2014-09-12 — End: 2014-09-13

## 2014-09-12 MED ORDER — HEPARIN (PORCINE) IN NACL 100-0.45 UNIT/ML-% IJ SOLN
1300.0000 [IU]/h | INTRAMUSCULAR | Status: DC
  Administered 2014-09-13: 1300 [IU]/h via INTRAVENOUS
  Filled 2014-09-12: qty 250

## 2014-09-12 MED ORDER — ASPIRIN EC 81 MG PO TBEC
81.0000 mg | DELAYED_RELEASE_TABLET | Freq: Every day | ORAL | Status: DC
  Administered 2014-09-13: 81 mg via ORAL
  Filled 2014-09-12: qty 1

## 2014-09-12 MED ORDER — ASPIRIN 81 MG PO TABS: 81.0000 mg | ORAL_TABLET | Freq: Every day | ORAL | Status: DC

## 2014-09-12 MED ORDER — ONDANSETRON HCL 4 MG/2ML IJ SOLN
4.0000 mg | Freq: Four times a day (QID) | INTRAMUSCULAR | Status: DC | PRN
  Administered 2014-09-13: 4 mg via INTRAVENOUS

## 2014-09-12 MED ORDER — NITROGLYCERIN 2 % TD OINT
0.5000 [in_us] | TOPICAL_OINTMENT | Freq: Four times a day (QID) | TRANSDERMAL | Status: DC
  Administered 2014-09-12 – 2014-09-13 (×4): 0.5 [in_us] via TOPICAL
  Filled 2014-09-12: qty 30

## 2014-09-12 MED ORDER — ASPIRIN 300 MG RE SUPP: 300.0000 mg | RECTAL | Status: AC

## 2014-09-12 MED ORDER — SODIUM CHLORIDE 0.9 % IV SOLN
1000.0000 mL | INTRAVENOUS | Status: DC
  Administered 2014-09-12: 1000 mL via INTRAVENOUS

## 2014-09-12 MED ORDER — METOPROLOL TARTRATE 12.5 MG HALF TABLET
12.5000 mg | ORAL_TABLET | Freq: Two times a day (BID) | ORAL | Status: DC
  Administered 2014-09-12 – 2014-09-13 (×3): 12.5 mg via ORAL
  Filled 2014-09-12 (×3): qty 1

## 2014-09-12 NOTE — ED Notes (Signed)
MD at bedside. 

## 2014-09-12 NOTE — Progress Notes (Signed)
ANTICOAGULATION CONSULT NOTE - Follow-Up Consult  Pharmacy Consult for Heparin Indication: chest pain/ACS  Allergies  Allergen Reactions  . Hydromorphone Anaphylaxis    Other reaction(s): ANAPHYLAXIS  . Morphine And Related Other (See Comments)    Syncope and stopped breathing  . Tamiflu [Oseltamivir] Palpitations  . Codeine Palpitations and Other (See Comments)    syncope  . Penicillins Hives and Rash    Patient Measurements: Height: 6' 3.5" (191.8 cm) Weight: 265 lb (120.203 kg) IBW/kg (Calculated) : 85.65 Heparin Dosing Weight:  111 kg  Vital Signs: Temp: 97.9 F (36.6 C) (05/17 2045) Temp Source: Oral (05/17 2045) BP: 104/55 mmHg (05/17 2045) Pulse Rate: 63 (05/17 2045)  Labs:  Recent Labs  09/12/14 0757 09/12/14 2119  HGB 14.0 13.5  HCT 40.8 39.2  PLT 200 177  APTT 25  --   LABPROT 13.4  --   INR 1.01  --   HEPARINUNFRC  --  0.92*  CREATININE 1.58* 1.33*    Estimated Creatinine Clearance: 91.4 mL/min (by C-G formula based on Cr of 1.33).   Medical History: Past Medical History  Diagnosis Date  . Shortness of breath     ' AT TIMES "  . GERD (gastroesophageal reflux disease)   . High cholesterol   . Acid reflux   . Refusal of blood transfusions as patient is Jehovah's Witness   . Complication of anesthesia     "anesthesia burndt my skin in ~ 04/2014 when I had colonoscopy"  . Heart murmur     "when I was little"  . Myocardial infarction 2003  . Pneumonia     "once when I was real little"  . Type II diabetes mellitus   . Stroke 2003    LEFT SIDE RESIDUAL (09/12/2014)  . Arthritis     "probably in my knees" (09/12/2014)  . Chronic lower back pain   . Lumbar herniated disc     "L3, 4, 5" (09/12/2014)    Assessment:  54 yo male admitted with chest pain, pharmacy dosing IV heparin.  Initial heparin level supratherapeutic at 0.92.  No bleeding or complications noted per chart notes.    Goal of Therapy:  Heparin level 0.3-0.7 units/ml Monitor  platelets by anticoagulation protocol: Yes   Plan:  Decrease IV heparin to 1300 units/hr. Recheck heparin level with AM labs. Daily HL and CBC  Tad MooreJessica Quincey Nored, Pharm D, BCPS  Clinical Pharmacist Pager [phone]  09/12/2014 10:34 PM

## 2014-09-12 NOTE — ED Provider Notes (Addendum)
CSN: [id number]     Arrival date & time 09/12/14  0746 History   First MD Initiated Contact with Patient 09/12/14 [phone]     Chief Complaint  Patient presents with  . Chest Pain    HPI Patient presents to the emergency room with complaints of acute chest pain. 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  Patient felt nauseated and short of breath. He also felt diaphoretic and weak. He got off the forklift. 911 was called.  Symptoms are somewhat better than they were earlier. He still has a 3 out of 10 pressure discomfort in the center and left side of his chest. The patient was given aspirin. EKG was transmitted. A code STEMI was called.  Cardiology, Dr. Eldridge DaceVaranasi has reviewed the EKG and canceled the code STEMI.  Patient has history of myocardial infarction in 2003 according to the records.  He has DM and high cholesterol.  He had a subsequent cardiac catheterization in November 2014.  The results of the cath are as follows: FINDINGS: LV showed good LV systolic function, mild LVH, EF of 60% to 65%. Left main was patent. LAD was patent. Diagonal 1 and 2 were small, which were patent. Left circumflex was large, which was patent. OM1 and OM2 were small, which were patent. OM3 and OM4 were moderate size, which were patent. RCA was patent. The patient has left dominant system. Past Medical History  Diagnosis Date  . Myocardial infarction 2003  . Heart murmur   . Shortness of breath     ' AT TIMES "  . Diabetes mellitus without complication     TYPE 2  . Stroke 2003    LEFT SIDE RESIDUAL PER PATIENT  . GERD (gastroesophageal reflux disease)   . High cholesterol   . Acid reflux    Past Surgical History  Procedure Laterality Date  . Cardiac catheterization  02/25/2013  . Left heart catheterization with coronary angiogram N/A 02/25/2013    Procedure: LEFT HEART CATHETERIZATION WITH CORONARY ANGIOGRAM;  Surgeon: Robynn PaneMohan N Harwani,  MD;  [facility];  Service: Cardiovascular;  Laterality: N/A;   No family history on file. History  Substance Use Topics  . Smoking status: Never Smoker   . Smokeless tobacco: Not on file  . Alcohol Use: No    Review of Systems  All other systems reviewed and are negative.     Allergies  Hydromorphone; Morphine and related; Tamiflu; Codeine; and Penicillins  Home Medications   Prior to Admission medications   Medication Sig Start Date End Date Taking? Authorizing Provider  aspirin 81 MG tablet Take 81 mg by mouth daily.   Yes Historical Provider, MD  lisinopril (PRINIVIL,ZESTRIL) 5 MG tablet Take 1 tablet (5 mg total) by mouth daily. 02/26/13  Yes Rinaldo CloudMohan Harwani, MD  lovastatin (MEVACOR) 40 MG tablet Take 1 tablet (40 mg total) by mouth daily. 02/26/13  Yes Rinaldo CloudMohan Harwani, MD  omeprazole (PRILOSEC) 20 MG capsule Take 20 mg by mouth daily.   Yes Historical Provider, MD  Travoprost, BAK Free, (TRAVATAN) 0.004 % SOLN ophthalmic solution Place 1 drop into both eyes at bedtime. 11/16/13  Yes Historical Provider, MD  traZODone (DESYREL) 50 MG tablet Take 25-100 mg by mouth at bedtime.   Yes Historical Provider, MD  cyclobenzaprine (FLEXERIL) 5 MG tablet Take 2 tablets (10 mg total) by mouth 2 (two) times daily as needed for muscle spasms. Patient not  taking: Reported on 09/12/2014 12/10/13   Teressa Lower, NP  gabapentin (NEURONTIN) 100 MG capsule Take 300 mg by mouth 3 (three) times daily.    Historical Provider, MD  HYDROcodone-acetaminophen (NORCO/VICODIN) 5-325 MG per tablet Take 1-2 tablets by mouth every 6 (six) hours as needed. Patient not taking: Reported on 09/12/2014 12/10/13   Teressa Lower, NP  metFORMIN (GLUCOPHAGE) 500 MG tablet Take 4 tablets (2,000 mg total) by mouth daily. Patient not taking: Reported on 09/12/2014 02/28/13   Rinaldo Cloud, MD  methocarbamol (ROBAXIN) 500 MG tablet Take 1 tablet (500 mg total) by mouth 2 (two) times daily as needed. Patient not  taking: Reported on 09/12/2014 04/14/13   Garlon Hatchet, PA-C  metoprolol tartrate (LOPRESSOR) 12.5 mg TABS tablet Take 0.5 tablets (12.5 mg total) by mouth 2 (two) times daily. Patient not taking: Reported on 09/12/2014 02/26/13   Rinaldo Cloud, MD  nitroGLYCERIN (NITROSTAT) 0.4 MG SL tablet Place 1 tablet (0.4 mg total) under the tongue every 5 (five) minutes x 3 doses as needed for chest pain. 02/26/13   Rinaldo Cloud, MD  Travoprost, BAK Free, (TRAVATAN) 0.004 % SOLN ophthalmic solution Place 1 drop into both eyes at bedtime.    Historical Provider, MD   BP 108/69 mmHg  Pulse 59  Temp(Src) 97.5 F (36.4 C) (Oral)  Resp 12  Ht 6' 3.5" (1.918 m)  Wt 265 lb (120.203 kg)  BMI 32.68 kg/m2  SpO2 97% Physical Exam  Constitutional: He appears well-developed and well-nourished. No distress.  HENT:  Head: Normocephalic and atraumatic.  Right Ear: External ear normal.  Left Ear: External ear normal.  Eyes: Conjunctivae are normal. Right eye exhibits no discharge. Left eye exhibits no discharge. No scleral icterus.  Neck: Neck supple. No tracheal deviation present.  Cardiovascular: Normal rate, regular rhythm and intact distal pulses.   Pulmonary/Chest: Effort normal and breath sounds normal. No stridor. No respiratory distress. He has no wheezes. He has no rales.  Abdominal: Soft. Bowel sounds are normal. He exhibits no distension. There is no tenderness. There is no rebound and no guarding.  Musculoskeletal: He exhibits no edema or tenderness.  Neurological: He is alert. He has normal strength. No cranial nerve deficit (no facial droop, extraocular movements intact, no slurred speech) or sensory deficit. He exhibits normal muscle tone. He displays no seizure activity. Coordination normal.  Skin: Skin is warm and dry. No rash noted.  Psychiatric: He has a normal mood and affect.  Nursing note and vitals reviewed.   ED Course  Procedures (including critical care time) Labs Review Labs  Reviewed  COMPREHENSIVE METABOLIC PANEL - Abnormal; Notable for the following:    Glucose, Bld 118 (*)    Creatinine, Ser 1.58 (*)    GFR calc non Af Amer 49 (*)    GFR calc Af Amer 56 (*)    All other components within normal limits  APTT  CBC  PROTIME-INR  I-STAT TROPOININ, ED    Imaging Review Dg Chest Portable 1 View  09/12/2014   CLINICAL DATA:  54 year old male with a history of nausea and chest pain.  EXAM: PORTABLE CHEST - 1 VIEW  COMPARISON:  07/11/2014  FINDINGS: The heart size and mediastinal contours are within normal limits. Both lungs are clear. The visualized skeletal structures are unremarkable.  IMPRESSION: No radiographic evidence of acute cardiopulmonary disease  Signed,  Yvone Neu. Loreta Ave, DO  [facility]  [facility]   Electronically Signed   By: Marijean Niemann  Loreta AveWagner D.O.   On: 09/12/2014 08:18     EKG Interpretation   Date/Time:  Tuesday Sep 12 2014 07:49:06 EDT Ventricular Rate:  63 PR Interval:  171 QRS Duration: 102 QT Interval:  387 QTC Calculation: 396 R Axis:   38 Text Interpretation:  Sinus rhythm Abnormal R-wave progression, early  transition upsloping ST elevation anterior and lateral increased from last  tracing but similar to ECG  26 Feb 2013 Confirmed by Meital Riehl  MD-J, Jasaiah Karwowski  [id number]) on 09/12/2014 7:58:57 AM     Medications  0.9 %  sodium chloride infusion (1,000 mLs Intravenous New Bag/Given 09/12/14 0806)  aspirin chewable tablet 324 mg (324 mg Oral Not Given 09/12/14 0802)  nitroGLYCERIN (NITROSTAT) SL tablet 0.4 mg (not administered)  nitroGLYCERIN (NITROGLYN) 2 % ointment 1 inch (1 inch Topical Given 09/12/14 0806)  fentaNYL (SUBLIMAZE) injection 75 mcg (75 mcg Intravenous Given 09/12/14 0805)    MDM   Final diagnoses:  Chest pain, unspecified chest pain type   The patient has history of MI according to the medical records but a normal cardiac cath in 2014.  He has no stents.  EKG with upsloping st  changes that are changed from the recent EKG but similar to previous EKGs.  Will start on asa, ntg.  Check cardiac enzymes, and follow closely.  Cardiology has cancelled cath lab activation at this point.  Initial cardiac enzymes are normal.  The patient had a cardiac catheterization 2 years ago without signs of obstructive disease. However he does have history of myocardial infarction according to the patient. I don't have the records of that visit. It's possible this may been related to vasospasm.  I will consult with Dr Sharyn LullHarwani his cardiologist regarding possible amdission, serial enzymes.     Linwood DibblesJon Shameria Trimarco, MD 09/12/14 1000  Pain is still 3/10.  Will repeat ECG.  Plan on serial enzymes repeat ECG  Linwood DibblesJon Jacole Capley, MD 09/12/14 1004

## 2014-09-12 NOTE — Progress Notes (Signed)
ANTICOAGULATION CONSULT NOTE - Initial Consult  Pharmacy Consult for Heparin Indication: chest pain/ACS  Allergies  Allergen Reactions  . Hydromorphone Anaphylaxis    Other reaction(s): ANAPHYLAXIS  . Morphine And Related Other (See Comments)    Syncope and stopped breathing  . Tamiflu [Oseltamivir] Palpitations  . Codeine Palpitations and Other (See Comments)    syncope  . Penicillins Hives and Rash    Patient Measurements: Height: 6' 3.5" (191.8 cm) Weight: 265 lb (120.203 kg) IBW/kg (Calculated) : 85.65 Heparin Dosing Weight:  111 kg  Vital Signs: Temp: 97.8 F (36.6 C) (05/17 1302) Temp Source: Oral (05/17 1302) BP: 120/85 mmHg (05/17 1302) Pulse Rate: 57 (05/17 1302)  Labs:  Recent Labs  09/12/14 0757  HGB 14.0  HCT 40.8  PLT 200  APTT 25  LABPROT 13.4  INR 1.01  CREATININE 1.58*    Estimated Creatinine Clearance: 77 mL/min (by C-G formula based on Cr of 1.58).   Medical History: Past Medical History  Diagnosis Date  . Myocardial infarction 2003  . Heart murmur   . Shortness of breath     ' AT TIMES "  . Diabetes mellitus without complication     TYPE 2  . Stroke 2003    LEFT SIDE RESIDUAL PER PATIENT  . GERD (gastroesophageal reflux disease)   . High cholesterol   . Acid reflux     Medications:  Prescriptions prior to admission  Medication Sig Dispense Refill Last Dose  . aspirin 81 MG tablet Take 81 mg by mouth daily.   09/11/2014 at Unknown time  . lisinopril (PRINIVIL,ZESTRIL) 5 MG tablet Take 1 tablet (5 mg total) by mouth daily. 30 tablet 3 09/11/2014 at Unknown time  . lovastatin (MEVACOR) 40 MG tablet Take 1 tablet (40 mg total) by mouth daily. 30 tablet 3 09/11/2014 at Unknown time  . omeprazole (PRILOSEC) 20 MG capsule Take 20 mg by mouth daily.   09/11/2014 at Unknown time  . Travoprost, BAK Free, (TRAVATAN) 0.004 % SOLN ophthalmic solution Place 1 drop into both eyes at bedtime.   09/11/2014 at Unknown time  . traZODone (DESYREL) 50  MG tablet Take 25-100 mg by mouth at bedtime.   09/11/2014 at Unknown time  . cyclobenzaprine (FLEXERIL) 5 MG tablet Take 2 tablets (10 mg total) by mouth 2 (two) times daily as needed for muscle spasms. (Patient not taking: Reported on 09/12/2014) 15 tablet 0 Past Week at Unknown time  . gabapentin (NEURONTIN) 100 MG capsule Take 300 mg by mouth 3 (three) times daily.   07/11/2014 at Unknown time  . HYDROcodone-acetaminophen (NORCO/VICODIN) 5-325 MG per tablet Take 1-2 tablets by mouth every 6 (six) hours as needed. (Patient not taking: Reported on 09/12/2014) 15 tablet 0   . metFORMIN (GLUCOPHAGE) 500 MG tablet Take 4 tablets (2,000 mg total) by mouth daily. (Patient not taking: Reported on 09/12/2014) 120 tablet 3 04/14/2013 at Unknown time  . methocarbamol (ROBAXIN) 500 MG tablet Take 1 tablet (500 mg total) by mouth 2 (two) times daily as needed. (Patient not taking: Reported on 09/12/2014) 14 tablet 0   . metoprolol tartrate (LOPRESSOR) 12.5 mg TABS tablet Take 0.5 tablets (12.5 mg total) by mouth 2 (two) times daily. (Patient not taking: Reported on 09/12/2014) 30 tablet 3 07/11/2014 at 1000  . nitroGLYCERIN (NITROSTAT) 0.4 MG SL tablet Place 1 tablet (0.4 mg total) under the tongue every 5 (five) minutes x 3 doses as needed for chest pain. 25 tablet 12 rescue  . Travoprost, BAK  Free, (TRAVATAN) 0.004 % SOLN ophthalmic solution Place 1 drop into both eyes at bedtime.   12/09/2013 at Unknown time    Assessment: CP, L arm pain.  Anticoagulation: Heparin for CP.  Cardiovascular: CAD, HTN, HLD Endocrinology: DM Gastrointestinal / Nutrition: GERD Neurology: h/o CVA Nephrology Pulmonary Hematology / Oncology PTA Medication Issues Best Practices  Goal of Therapy:  Heparin level 0.3-0.7 units/ml Monitor platelets by anticoagulation protocol: Yes   Plan:  Heparin 4000 unit IV bolus Heparin infusion 1550 units/hr Check heparin level in 6-8 hrs Daily HL and CBC   Tavon Corriher S. Merilynn Finlandobertson, PharmD,  BCPS Clinical Staff Pharmacist Pager [phone]  Misty Stanleyobertson, Khyle Goodell Stillinger 09/12/2014,1:14 PM

## 2014-09-12 NOTE — ED Notes (Signed)
Code Stemi cancelled by Cardiology.

## 2014-09-12 NOTE — H&P (Signed)
Ethan Rosales is an 54 y.o. male.   Chief Complaint: Chest pain/left arm pain HPI: Patient is 54 year old male with past medical history significant for mild coronary artery disease, hypertension, diabetes mellitus, history of CVA, hypercholesteremia, GERD, came to [facility] ED by EMS as code STEMI was called. Patient while at work complaining of retrosternal chest pain described as pressure associated with nausea and diaphoresis and mild shortness of breath grade 10 over 10 and relieved in few minutes . EKG done on the field showed normal sinus rhythm with early repolarization changes. Patient denies any history of exertional chest pain. Denies chest pain at present. Patient had similar presentation in 2014 and subsequently required cardiac catheterization which showed mild coronary artery disease.  Past Medical History  Diagnosis Date  . Myocardial infarction 2003  . Heart murmur   . Shortness of breath     ' AT TIMES "  . Diabetes mellitus without complication     TYPE 2  . Stroke 2003    LEFT SIDE RESIDUAL PER PATIENT  . GERD (gastroesophageal reflux disease)   . High cholesterol   . Acid reflux     Past Surgical History  Procedure Laterality Date  . Cardiac catheterization  02/25/2013  . Left heart catheterization with coronary angiogram N/A 02/25/2013    Procedure: LEFT HEART CATHETERIZATION WITH CORONARY ANGIOGRAM;  Surgeon: Clent Demark, MD;  Location: [facility] CATH LAB;  Service: Cardiovascular;  Laterality: N/A;    No family history on file. Social History:  reports that he has never smoked. He does not have any smokeless tobacco history on file. He reports that he does not drink alcohol or use illicit drugs.  Allergies:  Allergies  Allergen Reactions  . Hydromorphone Anaphylaxis    Other reaction(s): ANAPHYLAXIS  . Morphine And Related Other (See Comments)    Syncope and stopped breathing  . Tamiflu [Oseltamivir] Palpitations  . Codeine Palpitations and Other (See  Comments)    syncope  . Penicillins Hives and Rash     (Not in a hospital admission)  Results for orders placed or performed during the hospital encounter of 09/12/14 (from the past 48 hour(s))  APTT     Status: None   Collection Time: 09/12/14  7:57 AM  Result Value Ref Range   aPTT 25 24 - 37 seconds  CBC     Status: None   Collection Time: 09/12/14  7:57 AM  Result Value Ref Range   WBC 8.0 4.0 - 10.5 K/uL   RBC 4.81 4.22 - 5.81 MIL/uL   Hemoglobin 14.0 13.0 - 17.0 g/dL   HCT 40.8 39.0 - 52.0 %   MCV 84.8 78.0 - 100.0 fL   MCH 29.1 26.0 - 34.0 pg   MCHC 34.3 30.0 - 36.0 g/dL   RDW 13.5 11.5 - 15.5 %   Platelets 200 150 - 400 K/uL  Comprehensive metabolic panel     Status: Abnormal   Collection Time: 09/12/14  7:57 AM  Result Value Ref Range   Sodium 139 135 - 145 mmol/L   Potassium 3.7 3.5 - 5.1 mmol/L   Chloride 104 101 - 111 mmol/L   CO2 27 22 - 32 mmol/L   Glucose, Bld 118 (H) 65 - 99 mg/dL   BUN 20 6 - 20 mg/dL   Creatinine, Ser 1.58 (H) 0.61 - 1.24 mg/dL   Calcium 9.2 8.9 - 10.3 mg/dL   Total Protein 6.9 6.5 - 8.1 g/dL   Albumin 3.8 3.5 -  5.0 g/dL   AST 18 15 - 41 U/L   ALT 27 17 - 63 U/L   Alkaline Phosphatase 60 38 - 126 U/L   Total Bilirubin 0.6 0.3 - 1.2 mg/dL   GFR calc non Af Amer 49 (L) >60 mL/min   GFR calc Af Amer 56 (L) >60 mL/min    Comment: (NOTE) The eGFR has been calculated using the CKD EPI equation. This calculation has not been validated in all clinical situations. eGFR's persistently <60 mL/min signify possible Chronic Kidney Disease.    Anion gap 8 5 - 15  Protime-INR     Status: None   Collection Time: 09/12/14  7:57 AM  Result Value Ref Range   Prothrombin Time 13.4 11.6 - 15.2 seconds   INR 1.01 0.00 - 1.49  I-stat troponin, ED (0, 3, 6) - do not order at [facility]     Status: None   Collection Time: 09/12/14  8:09 AM  Result Value Ref Range   Troponin i, poc 0.00 0.00 - 0.08 ng/mL   Comment 3            Comment: Due to the release  kinetics of cTnI, a negative result within the first hours of the onset of symptoms does not rule out myocardial infarction with certainty. If myocardial infarction is still suspected, repeat the test at appropriate intervals.    Dg Chest Portable 1 View  09/12/2014   CLINICAL DATA:  54 year old male with a history of nausea and chest pain.  EXAM: PORTABLE CHEST - 1 VIEW  COMPARISON:  07/11/2014  FINDINGS: The heart size and mediastinal contours are within normal limits. Both lungs are clear. The visualized skeletal structures are unremarkable.  IMPRESSION: No radiographic evidence of acute cardiopulmonary disease  Signed,  Dulcy Fanny. Earleen Newport, DO  [facility]  [facility]   Electronically Signed   By: Corrie Mckusick D.O.   On: 09/12/2014 08:18    Review of Systems  Constitutional: Positive for diaphoresis. Negative for fever and chills.  Eyes: Negative for double vision and photophobia.  Respiratory: Positive for shortness of breath.   Cardiovascular: Positive for chest pain. Negative for palpitations, orthopnea and claudication.  Gastrointestinal: Positive for nausea. Negative for abdominal pain.  Genitourinary: Negative for dysuria.  Neurological: Positive for headaches. Negative for dizziness.    Blood pressure 108/69, pulse 59, temperature 97.5 F (36.4 C), temperature source Oral, resp. rate 12, height 6' 3.5" (1.918 m), weight 120.203 kg (265 lb), SpO2 97 %. Physical Exam  Constitutional: He is oriented to person, place, and time.  HENT:  Head: Normocephalic and atraumatic.  Eyes: Conjunctivae are normal. Pupils are equal, round, and reactive to light. Left eye exhibits no discharge. No scleral icterus.  Neck: Normal range of motion. Neck supple. No JVD present. No thyromegaly present.  Cardiovascular: Normal rate, regular rhythm and normal heart sounds.   Respiratory: Effort normal and breath sounds normal. He has no wheezes. He has no  rales.  GI: Soft. Bowel sounds are normal. He exhibits no distension. There is no tenderness. There is no rebound.  Musculoskeletal: He exhibits no edema or tenderness.  Neurological: He is alert and oriented to person, place, and time.     Assessment/Plan Chest pain with some features worrisome for angina rule out MI Hypertension Diabetes mellitus History of CVA in the past  Hypercholesteremia GERD Plan As per orders  Charolette Forward 09/12/2014, 11:43 AM

## 2014-09-12 NOTE — ED Notes (Signed)
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 Currently reports central chest pressure 3/10. Was given 324 aspirin, 18 gauge to left AC. BP 124/82, HR 76, ST elevation in V2-V5. EMS activated code stemi, was cancelled by cardiology. CBG 147. Pt is a x 4. Has hx of MI in 2003.

## 2014-09-13 ENCOUNTER — Encounter (HOSPITAL_COMMUNITY): Payer: Commercial Managed Care - PPO | Attending: Cardiology

## 2014-09-13 ENCOUNTER — Encounter (HOSPITAL_COMMUNITY): Payer: Commercial Managed Care - PPO

## 2014-09-13 ENCOUNTER — Observation Stay (HOSPITAL_COMMUNITY): Payer: Commercial Managed Care - PPO

## 2014-09-13 DIAGNOSIS — E119 Type 2 diabetes mellitus without complications: Secondary | ICD-10-CM | POA: Diagnosis not present

## 2014-09-13 DIAGNOSIS — I252 Old myocardial infarction: Secondary | ICD-10-CM | POA: Diagnosis not present

## 2014-09-13 DIAGNOSIS — I209 Angina pectoris, unspecified: Secondary | ICD-10-CM | POA: Insufficient documentation

## 2014-09-13 DIAGNOSIS — R079 Chest pain, unspecified: Secondary | ICD-10-CM | POA: Diagnosis not present

## 2014-09-13 DIAGNOSIS — K219 Gastro-esophageal reflux disease without esophagitis: Secondary | ICD-10-CM | POA: Diagnosis not present

## 2014-09-13 LAB — BASIC METABOLIC PANEL
Anion gap: 8 (ref 5–15)
BUN: 14 mg/dL (ref 6–20)
CO2: 24 mmol/L (ref 22–32)
Calcium: 9.1 mg/dL (ref 8.9–10.3)
Chloride: 107 mmol/L (ref 101–111)
Creatinine, Ser: 1.33 mg/dL — ABNORMAL HIGH (ref 0.61–1.24)
GFR calc Af Amer: >60 mL/min (ref >60)
GFR, EST NON AFRICAN AMERICAN: 60 mL/min — AB (ref >60)
Glucose, Bld: 107 mg/dL — ABNORMAL HIGH (ref 65–99)
POTASSIUM: 3.8 mmol/L (ref 3.5–5.1)
SODIUM: 139 mmol/L (ref 135–145)

## 2014-09-13 LAB — CBC
HEMATOCRIT: 40 % (ref 39.0–52.0)
HEMOGLOBIN: 13.7 g/dL (ref 13.0–17.0)
MCH: 29.1 pg (ref 26.0–34.0)
MCHC: 34.3 g/dL (ref 30.0–36.0)
MCV: 85.1 fL (ref 78.0–100.0)
Platelets: 170 10*3/uL (ref 150–400)
RBC: 4.7 MIL/uL (ref 4.22–5.81)
RDW: 13.5 % (ref 11.5–15.5)
WBC: 9.3 10*3/uL (ref 4.0–10.5)

## 2014-09-13 LAB — GLUCOSE, CAPILLARY
Glucose-Capillary: 100 mg/dL — ABNORMAL HIGH (ref 65–99)
Glucose-Capillary: 96 mg/dL (ref 65–99)

## 2014-09-13 LAB — LIPID PANEL
CHOL/HDL RATIO: 3.5 ratio
Cholesterol: 170 mg/dL (ref 0–200)
HDL: 48 mg/dL (ref >40)
LDL Cholesterol: 110 mg/dL — ABNORMAL HIGH (ref 0–99)
TRIGLYCERIDES: 62 mg/dL (ref <150)
VLDL: 12 mg/dL (ref 0–40)

## 2014-09-13 LAB — HEPARIN LEVEL (UNFRACTIONATED): HEPARIN UNFRACTIONATED: 1.62 [IU]/mL — AB (ref 0.30–0.70)

## 2014-09-13 MED ORDER — TECHNETIUM TC 99M SESTAMIBI GENERIC - CARDIOLITE
10.0000 | Freq: Once | INTRAVENOUS | Status: AC | PRN
  Administered 2014-09-13: 10 via INTRAVENOUS

## 2014-09-13 MED ORDER — REGADENOSON 0.4 MG/5ML IV SOLN
0.4000 mg | Freq: Once | INTRAVENOUS | Status: AC
  Administered 2014-09-13: 0.4 mg via INTRAVENOUS
  Filled 2014-09-13: qty 5

## 2014-09-13 MED ORDER — HEPARIN (PORCINE) IN NACL 100-0.45 UNIT/ML-% IJ SOLN
1000.0000 [IU]/h | INTRAMUSCULAR | Status: DC
  Administered 2014-09-13: 1000 [IU]/h via INTRAVENOUS

## 2014-09-13 MED ORDER — REGADENOSON 0.4 MG/5ML IV SOLN
INTRAVENOUS | Status: AC
  Filled 2014-09-13: qty 5

## 2014-09-13 MED ORDER — ONDANSETRON HCL 4 MG/2ML IJ SOLN
INTRAMUSCULAR | Status: AC
  Filled 2014-09-13: qty 2

## 2014-09-13 MED ORDER — TECHNETIUM TC 99M SESTAMIBI GENERIC - CARDIOLITE
30.0000 | Freq: Once | INTRAVENOUS | Status: AC | PRN
  Administered 2014-09-13: 30 via INTRAVENOUS

## 2014-09-13 NOTE — Discharge Instructions (Signed)

## 2014-09-13 NOTE — Progress Notes (Signed)
SStopped Heparin drip per orders

## 2014-09-13 NOTE — Progress Notes (Signed)
ANTICOAGULATION CONSULT NOTE  Pharmacy Consult for Heparin Indication: chest pain/ACS  Allergies  Allergen Reactions  . Hydromorphone Anaphylaxis    Other reaction(s): ANAPHYLAXIS  . Morphine And Related Other (See Comments)    Syncope and stopped breathing  . Tamiflu [Oseltamivir] Palpitations  . Codeine Palpitations and Other (See Comments)    syncope  . Penicillins Hives and Rash    Patient Measurements: Height: 6' 3.5" (191.8 cm) Weight: 264 lb 12.8 oz (120.112 kg) IBW/kg (Calculated) : 85.65 Heparin Dosing Weight:  111 kg  Vital Signs: Temp: 97.9 F (36.6 C) (05/18 0533) Temp Source: Oral (05/18 0533) BP: 121/78 mmHg (05/18 0533) Pulse Rate: 69 (05/18 0533)  Labs:  Recent Labs  09/12/14 0757 09/12/14 2119 09/13/14 0355  HGB 14.0 13.5 13.7  HCT 40.8 39.2 40.0  PLT 200 177 170  APTT 25  --   --   LABPROT 13.4  --   --   INR 1.01  --   --   HEPARINUNFRC  --  0.92* 1.62*  CREATININE 1.58* 1.33* 1.33*    Estimated Creatinine Clearance: 91.4 mL/min (by C-G formula based on Cr of 1.33).  Assessment:  54 y.o. male with chest pain for heparin   Goal of Therapy:  Heparin level 0.3-0.7 units/ml Monitor platelets by anticoagulation protocol: Yes   Plan:  Hold heparin x 90 min, then decrease heparin 1000 units/hr Check heparin level in 6 hours.   Geannie RisenGreg Julliette Frentz, PharmD, BCPS   09/13/2014 5:38 AM

## 2014-09-13 NOTE — Discharge Summary (Signed)
Discharge summary dictated on 09/13/2014 dictation [phone]

## 2014-09-14 LAB — HEMOGLOBIN A1C
Hgb A1c MFr Bld: 6.3 % — ABNORMAL HIGH (ref 4.8–5.6)
Mean Plasma Glucose: 134 mg/dL

## 2014-09-14 NOTE — Discharge Summary (Signed)
NAMJodell Cipro:  Barra, Linas            ACCOUNT NO.:  [id number]  MEDICAL RECORD NO.:  [id number]  LOCATION:  [id number]                        FACILITY:  [facility]  PHYSICIAN:  Eduardo OsierMohan N. Sharyn LullHarwani, M.D. DATE OF BIRTH:  1960/07/23  DATE OF ADMISSION:  09/12/2014 DATE OF DISCHARGE:  09/13/2014                              DISCHARGE SUMMARY   ADMITTING DIAGNOSES: 1. Chest pain with some features worrisome for angina, rule out     myocardial infarction. 2. Hypertension. 3. Diabetes mellitus. 4. History of cerebrovascular accident in the past. 5. Hypercholesteremia. 6. Gastroesophageal reflux disease.  DISCHARGE DIAGNOSES: 1. Status post chest pain, myocardial infarction ruled out, negative     nuclear stress test. 2. Hypertension. 3. Diabetes mellitus. 4. History of cerebrovascular accident in the past. 5. Hypercholesteremia. 6. Gastroesophageal reflux disease.  DISCHARGE HOME MEDICATIONS: 1. Aspirin 81 mg 1 tablet daily. 2. Lisinopril 5 mg daily. 3. Lovastatin 40 mg daily. 4. Metformin 500 mg 2 tablets twice daily. 5. Metoprolol tartrate 12.5 mg twice daily. 6. Nitrostat 0.4 mg sublingual p.r.n. 7. Omeprazole 20 mg daily. 8. Trazodone 50 mg daily.  DIET:  Low salt, low cholesterol 1800 calories ADA diet.  The patient has been advised to monitor blood pressure and blood sugar daily and chart.  ACTIVITY:  As tolerated.  CONDITION AT DISCHARGE:  Stable.  FOLLOWUP:  Follow up with me in 1 week brief.  BRIEF HISTORY AND HOSPITAL COURSE:  Mr. Perlie GoldRussell is a 54 year old male with past medical history significant for mild coronary artery disease, hypertension, diabetes mellitus, history of CVA, hypercholesteremia, GERD, he came to [facility] by EMS as code STEMI was called. The patient while at work complained of retrosternal chest pain, described as pressure associated with nausea and diaphoresis and mild shortness of breath.  Chest pain was rated 10/10.  Relieved in  few minutes.  EKG done on the field showed normal sinus rhythm with early repolarization changes.  Code STEMI was canceled.  The patient denies any history of exertional chest pain, denies chest pain at present.  The patient had similar presentation in 2014 and subsequently required cardiac catheterization, which showed mild coronary artery disease.  PHYSICAL EXAMINATION:  GENERAL:  He was alert, awake, oriented x3, in no acute distress. VITAL SIGNS:  Blood pressure was 108/69, pulse 59, he was afebrile. EYES:  Conjunctiva was pink. NECK:  Supple.  No JVD.  No bruit. LUNGS:  Clear to auscultation bilaterally. CARDIOVASCULAR:  S1, S2 was normal.  There was no S3 gallop. ABDOMEN:  Soft.  Bowel sounds are present.  Nontender. EXTREMITIES:  There is no clubbing, cyanosis, or edema.  LABORATORY DATA/IMAGING:  Sodium was 139, potassium 3.7, BUN 20, creatinine 1.58, repeat creatinine was 1.33.  Two sets of cardiac enzymes were negative.  Cholesterol was 170, triglycerides 62, HDL 48, LDL of 110.  Hemoglobin was 14, hematocrit 40.8, white count of 8.0.  Chest x-ray showed no radiographic evidence of acute cardiopulmonary disease.  EKG showed normal sinus rhythm with early repolarization changes.  No acute ischemic changes were noted.  Nuclear study showed no evidence of ischemia with normal EF.  BRIEF HOSPITAL COURSE:  The patient was admitted to telemetry unit.  MI  was ruled out by serial enzymes and EKG.  The patient subsequently underwent Lexiscan Myoview stress test, which showed no evidence of ischemia with normal LV systolic function and normal wall motion, and no evidence of reversible ischemia.  The patient did not have any further episodes of chest pain during the hospital stay.  The patient will be discharged home on above medications and will be followed up in my office in 1 week.  If continues to have recurrent chest pain, we will refer him to GI for further  evaluation.     Eduardo OsierMohan N. Sharyn LullHarwani, M.D.     MNH/MEDQ  D:  09/13/2014  T:  09/14/2014  Job:  [id number]

## 2014-12-06 ENCOUNTER — Ambulatory Visit: Payer: Worker's Compensation | Attending: Specialist

## 2014-12-06 DIAGNOSIS — G4733 Obstructive sleep apnea (adult) (pediatric): Secondary | ICD-10-CM | POA: Insufficient documentation

## 2014-12-06 DIAGNOSIS — G4761 Periodic limb movement disorder: Secondary | ICD-10-CM | POA: Insufficient documentation

## 2015-06-27 ENCOUNTER — Other Ambulatory Visit: Payer: Self-pay | Admitting: Orthopedic Surgery

## 2015-06-27 DIAGNOSIS — G8929 Other chronic pain: Secondary | ICD-10-CM

## 2015-06-27 DIAGNOSIS — M545 Low back pain, unspecified: Secondary | ICD-10-CM

## 2015-06-27 DIAGNOSIS — M47816 Spondylosis without myelopathy or radiculopathy, lumbar region: Secondary | ICD-10-CM

## 2015-07-05 ENCOUNTER — Ambulatory Visit
Admission: RE | Admit: 2015-07-05 | Discharge: 2015-07-05 | Disposition: A | Discharge status: Home / Self Care | Payer: Worker's Compensation | Source: Ambulatory Visit | Attending: Orthopedic Surgery | Admitting: Orthopedic Surgery

## 2015-07-05 DIAGNOSIS — M545 Low back pain, unspecified: Secondary | ICD-10-CM

## 2015-07-05 DIAGNOSIS — M47816 Spondylosis without myelopathy or radiculopathy, lumbar region: Secondary | ICD-10-CM

## 2015-07-05 DIAGNOSIS — G8929 Other chronic pain: Secondary | ICD-10-CM

## 2016-01-31 ENCOUNTER — Other Ambulatory Visit: Payer: Self-pay | Admitting: Orthopedic Surgery

## 2016-01-31 DIAGNOSIS — G5702 Lesion of sciatic nerve, left lower limb: Secondary | ICD-10-CM

## 2016-02-19 ENCOUNTER — Other Ambulatory Visit: Payer: Self-pay

## 2017-08-05 ENCOUNTER — Other Ambulatory Visit: Payer: Self-pay | Admitting: Orthopedic Surgery

## 2017-08-05 DIAGNOSIS — M5416 Radiculopathy, lumbar region: Secondary | ICD-10-CM

## 2017-08-10 ENCOUNTER — Ambulatory Visit
Admission: RE | Admit: 2017-08-10 | Discharge: 2017-08-10 | Disposition: A | Discharge status: Home / Self Care | Payer: Medicaid Other | Source: Ambulatory Visit | Attending: Orthopedic Surgery | Admitting: Orthopedic Surgery

## 2017-08-10 DIAGNOSIS — M5416 Radiculopathy, lumbar region: Secondary | ICD-10-CM

## 2017-09-22 ENCOUNTER — Encounter: Payer: Self-pay | Admitting: Physical Therapy

## 2017-09-22 ENCOUNTER — Ambulatory Visit: Payer: Medicaid Other | Attending: Orthopedic Surgery | Admitting: Physical Therapy

## 2017-09-22 DIAGNOSIS — G8929 Other chronic pain: Secondary | ICD-10-CM

## 2017-09-22 DIAGNOSIS — M25512 Pain in left shoulder: Secondary | ICD-10-CM | POA: Insufficient documentation

## 2017-09-22 DIAGNOSIS — M6281 Muscle weakness (generalized): Secondary | ICD-10-CM | POA: Diagnosis present

## 2017-09-22 DIAGNOSIS — M25612 Stiffness of left shoulder, not elsewhere classified: Secondary | ICD-10-CM | POA: Diagnosis present

## 2017-09-23 ENCOUNTER — Other Ambulatory Visit: Payer: Self-pay

## 2017-09-23 NOTE — Therapy (Signed)
[facility] [address] [place], [place], [address] Phone: [phone]   Fax:  [phone]  Physical Therapy Evaluation  Patient Details  Name: Ethan Rosales MRN: [id number] Date of Birth: Jan 09, 1961 Referring Provider: Dr. Roney Mans   Encounter Date: 09/22/2017  PT End of Session - 09/23/17 1156    Visit Number  1    Number of Visits  8    Date for PT Re-Evaluation  11/17/17    PT Start Time  1511    PT Stop Time  1603    PT Time Calculation (min)  52 min    Activity Tolerance  Patient limited by pain    Behavior During Therapy  [facility] for tasks assessed/performed       Past Medical History:  Diagnosis Date  . Acid reflux   . Arthritis    "probably in my knees" (09/12/2014)  . Chronic lower back pain   . Complication of anesthesia    "anesthesia burndt my skin in ~ 04/2014 when I had colonoscopy"  . GERD (gastroesophageal reflux disease)   . Heart murmur    "when I was little"  . High cholesterol   . Lumbar herniated disc    "L3, 4, 5" (09/12/2014)  . Myocardial infarction (HCC) 2003  . Pneumonia    "once when I was real little"  . Refusal of blood transfusions as patient is Jehovah's Witness   . Shortness of breath    ' AT TIMES "  . Stroke [facility]) 2003   LEFT SIDE RESIDUAL (09/12/2014)  . Type II diabetes mellitus (HCC)     Past Surgical History:  Procedure Laterality Date  . CARDIAC CATHETERIZATION  02/25/2013  . FOOT SURGERY Bilateral 1999   "took bones out of my toes so I could walk"  . LEFT HEART CATHETERIZATION WITH CORONARY ANGIOGRAM N/A 02/25/2013   Procedure: LEFT HEART CATHETERIZATION WITH CORONARY ANGIOGRAM;  Surgeon: Robynn Pane, MD;  Location: [facility] CATH LAB;  Service: Cardiovascular;  Laterality: N/A;    There were no vitals filed for this visit.   Subjective Assessment - 09/22/17 1526    Subjective  Pt. s/p L shoulder surgery/ biceps tenodesis on 09/14/17    Pertinent History  SI joint fusion  on L in 2017.  Pt. is filing for disability due to walking limitations.  Pt. enjoys fishing but unable to do right now due to L shoulder limitations.  Lives with 57 y/o father (had a stroke and dementia).        Limitations  Lifting;Writing;House hold activities    Patient Stated Goals  Increase L shoulder ROM/ strength.      Currently in Pain?  Yes    Pain Score  8     Pain Location  Shoulder    Pain Orientation  Left    Pain Descriptors / Indicators  Throbbing    Pain Type  Surgical pain    Pain Radiating Towards  Pain into L anterior neck.      Multiple Pain Sites  Yes    Pain Score  7    Pain Location  Back    Pain Orientation  Left    Pain Descriptors / Indicators  Throbbing;Aching;Pressure    Pain Onset  More than a month ago          See HEP.  Pt. Instructed on MD protocol and sling use.     PT Education - 09/23/17 1149    Education provided  Yes    Education Details  See HEP    Person(s) Educated  Patient    Methods  Explanation;Demonstration    Comprehension  Verbalized understanding;Returned demonstration          PT Long Term Goals - 09/23/17 1155      PT LONG TERM GOAL #1   Title  Pt. will increase FOTO from 30 to 60 to improve pain-free mobility.      Baseline  FOTO baseline on 5/28: 30    Time  8    Period  Weeks    Status  New    Target Date  11/17/17      PT LONG TERM GOAL #2   Title  Pt. will increase L shoulder AROM to WNL as compared to R shoulder to improve pain-free mobility.      Baseline  Supine: R shoulder AROM WNL. L shoulder PROM flexion: 118 deg., abd.: 91 deg., ER: 32 deg., IR: 78 deg. No resisted movement with L sh./biceps.    Time  8    Period  Weeks    Status  New    Target Date  11/17/17      PT LONG TERM GOAL #3   Title  Pt. will progress L shoulder strength to grossly 4+/5 MMT to improve carrying/ lifting tasks.     Baseline  No strength testing/ resisted ex. at this time.     Time  8    Period  Weeks    Status  New     Target Date  11/17/17      PT LONG TERM GOAL #4   Title  Pt. able to return to fishing with no L shoulder/elbow pain or limitations.      Baseline  Not able to fish at this time due to L sh. limitations.      Time  8    Period  Weeks    Status  New    Target Date  11/17/17             Plan - 09/22/17 1534    Clinical Impression Statement  Pt. is a 57 y/o male s/p L shoulder surgery (see MD protocol) on 09/14/17.  Pt. reports 8/10 L sh./ anterior neck pain at rest.  Pt. currently not wearing sling upon entering PT clinic.  PT educated pt. on MD orders/ protocol.  Supine:  R shoulder AROM WNL.  L shoulder PROM flexion: 118 deg., abd.: 91 deg., ER: 32 deg., IR: 78 deg.  No resisted movement with L sh./biceps.  Grip strength:  L: 44#, R: 117#.  FOTO: baseline 30/ goal 60.  Moderate scar adhesions/ tenderness with palpation and educated on scar massage.  Pt. will benefit from skilled PT services to increase L shoulder AROM/ strength to improve pain-free mobility/ return to fishing.       History and Personal Factors relevant to plan of care:  Pt. prefers to sleep on L side and is now sleeping on R side with pillow under L shoulder.  R arm dominant.      Rehab Potential  Fair    PT Frequency  1x / week    PT Duration  8 weeks    PT Treatment/Interventions  ADLs/Self Care Home Management;Moist Heat;Cryotherapy;Electrical Stimulation;Therapeutic activities;Functional mobility training;Therapeutic exercise;Neuromuscular re-education;Patient/family education;Manual techniques;Scar mobilization;Passive range of motion    PT Next Visit Plan  Progress HEP/ L shoulder ROM (follow MD protocol).         Patient will benefit  from skilled therapeutic intervention in order to improve the following deficits and impairments:  Decreased mobility, Hypomobility, Decreased scar mobility, Decreased range of motion, Decreased activity tolerance, Decreased strength, Pain, Postural dysfunction, Impaired UE functional  use, Impaired flexibility  Visit Diagnosis: Chronic left shoulder pain  Muscle weakness (generalized)  Shoulder joint stiffness, left     Problem List Patient Active Problem List   Diagnosis Date Noted  . Chest pain 09/12/2014   Cammie Mcgee, PT, DPT # [phone] 09/23/2017, 12:01 PM  [facility] [address] [place], [place], [address] Phone: [phone]   Fax:  [phone]  Name: Muhannad Bignell MRN: [id number] Date of Birth: 1960-11-04

## 2017-09-29 ENCOUNTER — Encounter: Payer: Self-pay | Admitting: Physical Therapy

## 2017-09-29 ENCOUNTER — Ambulatory Visit: Payer: Medicaid Other | Attending: Orthopedic Surgery | Admitting: Physical Therapy

## 2017-09-29 DIAGNOSIS — M25512 Pain in left shoulder: Secondary | ICD-10-CM | POA: Insufficient documentation

## 2017-09-29 DIAGNOSIS — M25612 Stiffness of left shoulder, not elsewhere classified: Secondary | ICD-10-CM | POA: Diagnosis present

## 2017-09-29 DIAGNOSIS — M6281 Muscle weakness (generalized): Secondary | ICD-10-CM

## 2017-09-29 DIAGNOSIS — G8929 Other chronic pain: Secondary | ICD-10-CM | POA: Diagnosis present

## 2017-09-30 NOTE — Therapy (Signed)
[facility] [address] [place], [place], [address] Phone: [phone]   Fax:  [phone]  Physical Therapy Treatment  Patient Details  Name: Ethan Rosales MRN: [id number] Date of Birth: Jul 17, 1960 Referring Provider: Dr. Roney Mans   Encounter Date: 09/29/2017  PT End of Session - 09/29/17 0957    Visit Number  2    Number of Visits  8    Date for PT Re-Evaluation  11/17/17    PT Start Time  0945    PT Stop Time  1047    PT Time Calculation (min)  62 min    Activity Tolerance  Patient limited by pain    Behavior During Therapy  [facility] for tasks assessed/performed       Past Medical History:  Diagnosis Date  . Acid reflux   . Arthritis    "probably in my knees" (09/12/2014)  . Chronic lower back pain   . Complication of anesthesia    "anesthesia burndt my skin in ~ 04/2014 when I had colonoscopy"  . GERD (gastroesophageal reflux disease)   . Heart murmur    "when I was little"  . High cholesterol   . Lumbar herniated disc    "L3, 4, 5" (09/12/2014)  . Myocardial infarction (HCC) 2003  . Pneumonia    "once when I was real little"  . Refusal of blood transfusions as patient is Jehovah's Witness   . Shortness of breath    ' AT TIMES "  . Stroke [facility]) 2003   LEFT SIDE RESIDUAL (09/12/2014)  . Type II diabetes mellitus (HCC)     Past Surgical History:  Procedure Laterality Date  . CARDIAC CATHETERIZATION  02/25/2013  . FOOT SURGERY Bilateral 1999   "took bones out of my toes so I could walk"  . LEFT HEART CATHETERIZATION WITH CORONARY ANGIOGRAM N/A 02/25/2013   Procedure: LEFT HEART CATHETERIZATION WITH CORONARY ANGIOGRAM;  Surgeon: Robynn Pane, MD;  Location: [facility] CATH [facility];  Service: Cardiovascular;  Laterality: N/A;    There were no vitals filed for this visit.  Subjective Assessment - 09/29/17 0953    Subjective  Pt. reports 6/10 L shoulder pain at this time.  Pt. reports feeling stiff in sh./biceps at this  time.  Pt. entered PT with use of sling.  Pt. having NCV testing tomorrow for back/LE.      Pertinent History  SI joint fusion on L in 2017.  Pt. is filing for disability due to walking limitations.  Pt. enjoys fishing but unable to do right now due to L shoulder limitations.  Lives with 37 y/o father (had a stroke and dementia).        Limitations  Lifting;Writing;House hold activities    Patient Stated Goals  Increase L shoulder ROM/ strength.      Currently in Pain?  Yes    Pain Score  6     Pain Location  Shoulder    Pain Orientation  Left    Pain Descriptors / Indicators  Throbbing;Aching    Pain Type  Surgical pain         Treatment:   There.ex.:   Reviewed HEP.  Seated sh. Pulley: flexion/ abduction 30x each (cuing to increase hold time). Supine wand AAROM: press-ups/ flexion/ ER/ abd. (PT assist) 10x2 each.    Manual tx.:  Supine L shoulder AA/PROM all planes of movement  STM to L anterior deltoid/ prox. Biceps Scar massage to all incisions (instructed in home technique).  Ice to L shoulder in sitting after tx.    PT Long Term Goals - 09/23/17 1155      PT LONG TERM GOAL #1   Title  Pt. will increase FOTO from 30 to 60 to improve pain-free mobility.      Baseline  FOTO baseline on 5/28: 30    Time  8    Period  Weeks    Status  New    Target Date  11/17/17      PT LONG TERM GOAL #2   Title  Pt. will increase L shoulder AROM to WNL as compared to R shoulder to improve pain-free mobility.      Baseline  Supine: R shoulder AROM WNL. L shoulder PROM flexion: 118 deg., abd.: 91 deg., ER: 32 deg., IR: 78 deg. No resisted movement with L sh./biceps.    Time  8    Period  Weeks    Status  New    Target Date  11/17/17      PT LONG TERM GOAL #3   Title  Pt. will progress L shoulder strength to grossly 4+/5 MMT to improve carrying/ lifting tasks.     Baseline  No strength testing/ resisted ex. at this time.     Time  8    Period  Weeks    Status  New    Target  Date  11/17/17      PT LONG TERM GOAL #4   Title  Pt. able to return to fishing with no L shoulder/elbow pain or limitations.      Baseline  Not able to fish at this time due to L sh. limitations.      Time  8    Period  Weeks    Status  New    Target Date  11/17/17            Plan - 09/29/17 0957    Clinical Impression Statement  Pain limited/ guarded with seated shoulder pulley PROM to L shoulder.  After completion of L shoulder PROM/ manual stretches, pt. demonstrates marked increase in supine L shoulder AA/PROM with use of wand.  (+) tenderness over prox. biceps/ anterior deltoid incision.  Pt. instructed in scar massage and encouraged to continue with current HEP/ focus on increasing flexion/ abd. (as tolerated with increase static holds).      Clinical Presentation  Evolving    Clinical Decision Making  Moderate    Rehab Potential  Fair    PT Frequency  1x / week    PT Duration  8 weeks    PT Treatment/Interventions  ADLs/Self Care Home Management;Moist Heat;Cryotherapy;Electrical Stimulation;Therapeutic activities;Functional mobility training;Therapeutic exercise;Neuromuscular re-education;Patient/family education;Manual techniques;Scar mobilization;Passive range of motion    PT Next Visit Plan  Progress HEP/ L shoulder ROM (follow MD protocol).  Issue wand ex. next tx. session.        Patient will benefit from skilled therapeutic intervention in order to improve the following deficits and impairments:  Decreased mobility, Hypomobility, Decreased scar mobility, Decreased range of motion, Decreased activity tolerance, Decreased strength, Pain, Postural dysfunction, Impaired UE functional use, Impaired flexibility  Visit Diagnosis: Chronic left shoulder pain  Muscle weakness (generalized)  Shoulder joint stiffness, left     Problem List Patient Active Problem List   Diagnosis Date Noted  . Chest pain 09/12/2014   Cammie Mcgee, PT, DPT # [phone] 09/30/2017, 7:19  AM  [facility] [address] [place], [place],  [id number] Phone: [phone]   Fax:  [phone]  Name: Ethan Rosales MRN: [id number] Date of Birth: 14-Jan-1961

## 2017-10-06 ENCOUNTER — Encounter: Payer: Self-pay | Admitting: Physical Therapy

## 2017-10-06 ENCOUNTER — Ambulatory Visit: Payer: Medicaid Other | Admitting: Physical Therapy

## 2017-10-06 DIAGNOSIS — M6281 Muscle weakness (generalized): Secondary | ICD-10-CM

## 2017-10-06 DIAGNOSIS — G8929 Other chronic pain: Secondary | ICD-10-CM

## 2017-10-06 DIAGNOSIS — M25512 Pain in left shoulder: Principal | ICD-10-CM

## 2017-10-06 DIAGNOSIS — M25612 Stiffness of left shoulder, not elsewhere classified: Secondary | ICD-10-CM

## 2017-10-06 NOTE — Therapy (Signed)
[facility] [address] [place], [place], [address] Phone: [phone]   Fax:  [phone]  Physical Therapy Treatment  Patient Details  Name: Ethan Rosales MRN: [id number] Date of Birth: 1960-10-03 Referring Provider: Dr. Roney Mans   Encounter Date: 10/06/2017  PT End of Session - 10/06/17 1550    Visit Number  3    Number of Visits  8    Date for PT Re-Evaluation  11/17/17    PT Start Time  0925    PT Stop Time  1021    PT Time Calculation (min)  56 min    Activity Tolerance  Patient limited by pain    Behavior During Therapy  [facility] for tasks assessed/performed       Past Medical History:  Diagnosis Date  . Acid reflux   . Arthritis    "probably in my knees" (09/12/2014)  . Chronic lower back pain   . Complication of anesthesia    "anesthesia burndt my skin in ~ 04/2014 when I had colonoscopy"  . GERD (gastroesophageal reflux disease)   . Heart murmur    "when I was little"  . High cholesterol   . Lumbar herniated disc    "L3, 4, 5" (09/12/2014)  . Myocardial infarction (HCC) 2003  . Pneumonia    "once when I was real little"  . Refusal of blood transfusions as patient is Jehovah's Witness   . Shortness of breath    ' AT TIMES "  . Stroke [facility]) 2003   LEFT SIDE RESIDUAL (09/12/2014)  . Type II diabetes mellitus (HCC)     Past Surgical History:  Procedure Laterality Date  . CARDIAC CATHETERIZATION  02/25/2013  . FOOT SURGERY Bilateral 1999   "took bones out of my toes so I could walk"  . LEFT HEART CATHETERIZATION WITH CORONARY ANGIOGRAM N/A 02/25/2013   Procedure: LEFT HEART CATHETERIZATION WITH CORONARY ANGIOGRAM;  Surgeon: Robynn Pane, MD;  Location: [facility];  Service: Cardiovascular;  Laterality: N/A;    There were no vitals filed for this visit.  Subjective Assessment - 10/06/17 1544    Subjective  Pt. reports 8/10 L shoulder pain upon arrival to clinic.  Pt. reports that he "may have been  overdoing" stretching exercises at home, specifically pully which is why he has so much pain now.  Pt. reports to therapy without sling and reports that NCV testing to LE went well.    Pertinent History  SI joint fusion on L in 2017.  Pt. is filing for disability due to walking limitations.  Pt. enjoys fishing but unable to do right now due to L shoulder limitations.  Lives with 109 y/o father (had a stroke and dementia).        Limitations  Lifting;Writing;House hold activities    Patient Stated Goals  Increase L shoulder ROM/ strength.      Currently in Pain?  Yes    Pain Score  8     Pain Location  Shoulder    Pain Orientation  Left    Pain Descriptors / Indicators  Burning;Throbbing;Aching    Pain Type  Surgical pain    Pain Onset  1 to 4 weeks ago    Pain Frequency  Constant         Treatment:   There.ex.:   Recumbent arm bike 2 min x2 foward, 2 min x2 backward (warm-up not billed) Seated sh. pulley: flexion/ abduction 30x each (cuing to increase hold time).  Discussed HEP.  Manual tx.:  Supine L shoulder AA/PROM all planes of movement with use of TENS unit for pain management (set to level 4 of intensity) STM to L anterior deltoid/ prox. Biceps     Ice to L shoulder in sitting after tx. With TENS unit set for level 4 intensity.       PT Long Term Goals - 09/23/17 1155      PT LONG TERM GOAL #1   Title  Pt. will increase FOTO from 30 to 60 to improve pain-free mobility.      Baseline  FOTO baseline on 5/28: 30    Time  8    Period  Weeks    Status  New    Target Date  11/17/17      PT LONG TERM GOAL #2   Title  Pt. will increase L shoulder AROM to WNL as compared to R shoulder to improve pain-free mobility.      Baseline  Supine: R shoulder AROM WNL. L shoulder PROM flexion: 118 deg., abd.: 91 deg., ER: 32 deg., IR: 78 deg. No resisted movement with L sh./biceps.    Time  8    Period  Weeks    Status  New    Target Date  11/17/17      PT LONG TERM GOAL #3    Title  Pt. will progress L shoulder strength to grossly 4+/5 MMT to improve carrying/ lifting tasks.     Baseline  No strength testing/ resisted ex. at this time.     Time  8    Period  Weeks    Status  New    Target Date  11/17/17      PT LONG TERM GOAL #4   Title  Pt. able to return to fishing with no L shoulder/elbow pain or limitations.      Baseline  Not able to fish at this time due to L sh. limitations.      Time  8    Period  Weeks    Status  New    Target Date  11/17/17         Plan - 10/06/17 1551    Clinical Impression Statement  Pt. had difficulty with L shoulder abduction and flexion with usage of pulley due to pain.  Pt. tolerated PROM of L shoulder in flexion and abduction with usage of TENS unit.  Interferential current was used with intensity level 4 being comfortable for patient during PROM.  Patient making significant improvements to PROM with AAROM exercises and manual therapy provided.    Clinical Presentation  Evolving    Clinical Decision Making  Moderate    Rehab Potential  Fair    PT Frequency  1x / week    PT Duration  8 weeks    PT Treatment/Interventions  ADLs/Self Care Home Management;Moist Heat;Cryotherapy;Electrical Stimulation;Therapeutic activities;Functional mobility training;Therapeutic exercise;Neuromuscular re-education;Patient/family education;Manual techniques;Scar mobilization;Passive range of motion    PT Next Visit Plan  Progress HEP/ L shoulder ROM (follow MD protocol).  Issue wand ex. next tx. session.        Patient will benefit from skilled therapeutic intervention in order to improve the following deficits and impairments:  Decreased mobility, Hypomobility, Decreased scar mobility, Decreased range of motion, Decreased activity tolerance, Decreased strength, Pain, Postural dysfunction, Impaired UE functional use, Impaired flexibility  Visit Diagnosis: Chronic left shoulder pain  Muscle weakness (generalized)  Shoulder joint  stiffness, left     Problem List Patient  Active Problem List   Diagnosis Date Noted  . Chest pain 09/12/2014    Cammie Mcgee, PT, DPT # [phone] Tomasa Hose, SPT 10/06/2017, 5:24 PM  [place] [address] [place], [place], [address] Phone: [phone]   Fax:  [phone]  Name: Ethan Rosales MRN: [id number] Date of Birth: Apr 16, 1961

## 2017-10-13 ENCOUNTER — Ambulatory Visit: Payer: Medicaid Other | Admitting: Physical Therapy

## 2017-10-13 ENCOUNTER — Encounter: Payer: Self-pay | Admitting: Physical Therapy

## 2017-10-13 DIAGNOSIS — M25612 Stiffness of left shoulder, not elsewhere classified: Secondary | ICD-10-CM

## 2017-10-13 DIAGNOSIS — M6281 Muscle weakness (generalized): Secondary | ICD-10-CM

## 2017-10-13 DIAGNOSIS — M25512 Pain in left shoulder: Secondary | ICD-10-CM | POA: Diagnosis not present

## 2017-10-13 DIAGNOSIS — G8929 Other chronic pain: Secondary | ICD-10-CM

## 2017-10-13 NOTE — Patient Instructions (Signed)
Access Code: [email or web address]  URL: [email or web address]  Date: 10/13/2017  Prepared by: Dorene GrebeMichael Elka Satterfield   Exercises  Supine Shoulder Press with Dowel - 15 reps - 2 sets - 1x daily - 7x weekly  Supine Shoulder Flexion Extension AAROM with Dowel - 15 reps - 2 sets - 1x daily - 7x weekly  Supine Shoulder External Rotation AAROM with Dowel - 15 reps - 2 sets - 1x daily - 7x weekly  Supine Shoulder Abduction AAROM with Dowel - 15 reps - 2 sets - 1x daily - 7x weekly  Supine Scapular Protraction in Flexion with Dumbbells - 15 reps - 2 sets - 1x daily - 7x weekly

## 2017-10-13 NOTE — Therapy (Signed)
[facility] [address] [place], [place], [address] Phone: [phone]   Fax:  [phone]  Physical Therapy Treatment  Patient Details  Name: Ethan Rosales MRN: [id number] Date of Birth: [birth year] Referring Provider: Dr. Jeannie Fend   Encounter Date: 10/13/2017  PT End of Session - 10/13/17 1015    Visit Number  4    Number of Visits  8    Date for PT Re-Evaluation  11/17/17    PT Start Time  0914    PT Stop Time  1005    PT Time Calculation (min)  51 min    Activity Tolerance  Patient limited by pain    Behavior During Therapy  [facility] for tasks assessed/performed       Past Medical History:  Diagnosis Date  . Acid reflux   . Arthritis    "probably in my knees" (09/12/2014)  . Chronic lower back pain   . Complication of anesthesia    "anesthesia burndt my skin in ~ 04/2014 when I had colonoscopy"  . GERD (gastroesophageal reflux disease)   . Heart murmur    "when I was little"  . High cholesterol   . Lumbar herniated disc    "L3, 4, 5" (09/12/2014)  . Myocardial infarction ([place]) 2003  . Pneumonia    "once when I was real little"  . Refusal of blood transfusions as patient is Jehovah's Witness   . Shortness of breath    ' AT TIMES "  . Stroke [facility]) 2003   LEFT SIDE RESIDUAL (09/12/2014)  . Type II diabetes mellitus ([place])     Past Surgical History:  Procedure Laterality Date  . CARDIAC CATHETERIZATION  02/25/2013  . FOOT SURGERY Bilateral 1999   "took bones out of my toes so I could walk"  . LEFT HEART CATHETERIZATION WITH CORONARY ANGIOGRAM N/A 02/25/2013   Procedure: LEFT HEART CATHETERIZATION WITH CORONARY ANGIOGRAM;  Surgeon: Clent Demark, MD;  Location: [place] CATH LAB;  Service: Cardiovascular;  Laterality: N/A;    There were no vitals filed for this visit.  Subjective Assessment - 10/13/17 0923    Subjective  Pt. reports 7-8/10 L shoulder pain.  Pt. reports he is not sleeping well at night.  Pt. sleeping  about 2-3 hours/night.  Good compliance with HEP/ MD protocol.  Pt. returns to MD 6/28?Marland Kitchen       Pertinent History  SI joint fusion on L in 2017.  Pt. is filing for disability due to walking limitations.  Pt. enjoys fishing but unable to do right now due to L shoulder limitations.  Lives with 58 y/o father (had a stroke and dementia).        Limitations  Lifting;Writing;House hold activities    Patient Stated Goals  Increase L shoulder ROM/ strength.      Currently in Pain?  Yes    Pain Score  8     Pain Location  Shoulder    Pain Orientation  Left    Pain Descriptors / Indicators  Burning;Throbbing;Aching    Pain Type  Surgical pain         Treatment:   There.ex.:   UBE 2 min x2 foward, 1 min x1 backward (warm-up not billed) Supine wand ex. (see handouts/ new HEP).  Supine serratus punches (no wt.).   Discussed HEP.  Manual tx.:  Supine L shoulder AA/PROM all planes of movement 10x each.   Reassessed AAROM.   STM to L anterior deltoid/ prox. Biceps  No modalities during tx. Session.       PT Education - 10/13/17 1013    Education provided  Yes    Education Details  See HEP    Person(s) Educated  Patient    Methods  Explanation;Demonstration    Comprehension  Verbalized understanding;Returned demonstration          PT Long Term Goals - 10/13/17 1018      PT LONG TERM GOAL #1   Title  Pt. will increase FOTO from 30 to 60 to improve pain-free mobility.      Baseline  FOTO baseline on 5/28: 30.  Today (6/18): 47    Time  4    Period  Weeks    Status  Partially Met    Target Date  11/17/17      PT LONG TERM GOAL #2   Title  Pt. will increase L shoulder AROM to WNL as compared to R shoulder to improve pain-free mobility.      Baseline  L shoulder AAROM flexion: 142 deg., abd.: 118 deg., ER: 64 deg., IR: 78 deg. No resisted movement with L sh./biceps. Grip strength: L: 66#, R: 131#.     Time  4    Period  Weeks    Status  Partially Met    Target Date   11/17/17      PT LONG TERM GOAL #3   Title  Pt. will progress L shoulder strength to grossly 4+/5 MMT to improve carrying/ lifting tasks.     Baseline  No strength testing/ resisted ex. at this time.     Time  4    Period  Weeks    Status  On-going    Target Date  11/17/17      PT LONG TERM GOAL #4   Title  Pt. able to return to fishing with no L shoulder/elbow pain or limitations.      Baseline  attempted and lasted 20 minutes (pain).      Time  4    Period  Weeks    Status  Not Met    Target Date  11/17/17            Plan - 10/13/17 0935    Clinical Impression Statement  Pt. progressing well with MD protocol but remains pain limited with L shoulder ROM/ ex. program/ daily tasks.  No isometrics for L sh. at this time due to pain limited AAROM.  Supine: R shoulder AROM WNL. L shoulder AAROM flexion: 142 deg., abd.: 118 deg., ER: 64 deg., IR: 78 deg. No resisted movement with L sh./biceps. Grip strength: L: 66#, R: 131#. FOTO: baseline 30/ today 47/ goal 60.  Marked improvement with all aspects of L sh. AAROM/ grip strength and outcome measures.  Pt. will benefit from continued PT services to increase L sh. AROM/ stability to promote return to daily activities/ fishing.       Clinical Presentation  Evolving    Clinical Decision Making  Moderate    Rehab Potential  Fair    PT Frequency  1x / week    PT Duration  8 weeks    PT Treatment/Interventions  ADLs/Self Care Home Management;Moist Heat;Cryotherapy;Electrical Stimulation;Therapeutic activities;Functional mobility training;Therapeutic exercise;Neuromuscular re-education;Patient/family education;Manual techniques;Scar mobilization;Passive range of motion    PT Next Visit Plan  Progress HEP/ L shoulder ROM (follow MD protocol).  Progress HEP/ isometrics.  Reassess use of wand with HEP.  CHECK CAID AUTHORIZATION.  Patient will benefit from skilled therapeutic intervention in order to improve the following deficits and  impairments:  Decreased mobility, Hypomobility, Decreased scar mobility, Decreased range of motion, Decreased activity tolerance, Decreased strength, Pain, Postural dysfunction, Impaired UE functional use, Impaired flexibility  Visit Diagnosis: Chronic left shoulder pain  Muscle weakness (generalized)  Shoulder joint stiffness, left     Problem List Patient Active Problem List   Diagnosis Date Noted  . Chest pain 09/12/2014   Pura Spice, PT, DPT # [phone] 10/13/2017, 10:21 AM  [place] [facility] [address] [place], [place], [address] Phone: [phone]   Fax:  [phone]  Name: Ethan Rosales MRN: [id number] Date of Birth: 28-Feb-1961

## 2017-10-20 ENCOUNTER — Ambulatory Visit: Payer: Medicaid Other

## 2017-10-22 ENCOUNTER — Ambulatory Visit: Payer: Medicaid Other | Admitting: Physical Therapy

## 2017-10-22 ENCOUNTER — Encounter: Payer: Self-pay | Admitting: Physical Therapy

## 2017-10-22 DIAGNOSIS — G8929 Other chronic pain: Secondary | ICD-10-CM

## 2017-10-22 DIAGNOSIS — M6281 Muscle weakness (generalized): Secondary | ICD-10-CM

## 2017-10-22 DIAGNOSIS — M25512 Pain in left shoulder: Secondary | ICD-10-CM | POA: Diagnosis not present

## 2017-10-22 DIAGNOSIS — M25612 Stiffness of left shoulder, not elsewhere classified: Secondary | ICD-10-CM

## 2017-10-22 NOTE — Patient Instructions (Signed)
Access Code: [email or web address]  URL: [email or web address]  Date: 10/22/2017  Prepared by: Dorene GrebeMichael Andreana Klingerman   Exercises  Isometric Shoulder Flexion at Wall - 10 reps - 2 sets - 5 hold - 1x daily - 4x weekly  Isometric Shoulder Extension at Wall - 10 reps - 2 sets - 5 hold - 1x daily - 4x weekly  Isometric Shoulder External Rotation with Ball at Wall - 10 reps - 2 sets - 5 hold - 1x daily - 4x weekly  Standing Isometric Shoulder Internal Rotation at Doorway - 10 reps - 2 sets - 5 hold - 1x daily - 4x weekly  Scapular Retraction with Resistance - 10 reps - 2 sets - 1x daily - 4x weekly  Standing Shoulder Flexion Wall Walk - 10 reps - 2 sets - 1x daily - 4x weekly

## 2017-10-23 NOTE — Therapy (Addendum)
[facility] [address] [place], [place], [address] Phone: [phone]   Fax:  [phone]  Physical Therapy Treatment  Patient Details  Name: Ethan Rosales MRN: [id number] Date of Birth: 29-Aug-1960 Referring Provider: Dr. Jeannie Fend   Encounter Date: 10/22/2017   Treatment 5 of 8.  Recert date: 6/96/29   Past Medical History:  Diagnosis Date  . Acid reflux   . Arthritis    "probably in my knees" (09/12/2014)  . Chronic lower back pain   . Complication of anesthesia    "anesthesia burndt my skin in ~ 04/2014 when I had colonoscopy"  . GERD (gastroesophageal reflux disease)   . Heart murmur    "when I was little"  . High cholesterol   . Lumbar herniated disc    "L3, 4, 5" (09/12/2014)  . Myocardial infarction ([place]) 2003  . Pneumonia    "once when I was real little"  . Refusal of blood transfusions as patient is Jehovah's Witness   . Shortness of breath    ' AT TIMES "  . Stroke [facility]) 2003   LEFT SIDE RESIDUAL (09/12/2014)  . Type II diabetes mellitus ([place])     Past Surgical History:  Procedure Laterality Date  . CARDIAC CATHETERIZATION  02/25/2013  . FOOT SURGERY Bilateral 1999   "took bones out of my toes so I could walk"  . LEFT HEART CATHETERIZATION WITH CORONARY ANGIOGRAM N/A 02/25/2013   Procedure: LEFT HEART CATHETERIZATION WITH CORONARY ANGIOGRAM;  Surgeon: Clent Demark, MD;  Location: [facility];  Service: Cardiovascular;  Laterality: N/A;    There were no vitals filed for this visit.      Pt. still c/o persistent L shoulder/UE pain and having difficulty with sleeping position/ daily tasks. Pt. states his whole L side is giving him issues. Pt. returns to MD tomorrow for f/u. Pt. 5+ weeks s/p L sh. surgery.       Treatment:   There.ex.:  Supine wand ex. (reviewed HEP).  Supine serratus punches (no wt.). Added isometrics to L shoulder (see handouts)   Discussed HEP.  Manual  tx.:  Supine L shoulder AA/PROM all planes of movement 10x each.   Reassessed AAROM.   STM to L anterior deltoid/ prox. Biceps       FOTO: initial 30/ today 41 (marked improvement)/ goal 60. Pt. remains pain focused/ limited with L shoulder ROM (all planes). PT focusing on gentle static stretches of L shoulder in supine position with progression of MD protocol/ HEP. Pt. will discuss pain with MD during next f/u visit prior to returning to PT next week.      PT Long Term Goals - 10/13/17 1018      PT LONG TERM GOAL #1   Title  Pt. will increase FOTO from 30 to 60 to improve pain-free mobility.      Baseline  FOTO baseline on 5/28: 30.  Today (6/18): 47    Time  4    Period  Weeks    Status  Partially Met    Target Date  11/17/17      PT LONG TERM GOAL #2   Title  Pt. will increase L shoulder AROM to WNL as compared to R shoulder to improve pain-free mobility.      Baseline  L shoulder AAROM flexion: 142 deg., abd.: 118 deg., ER: 64 deg., IR: 78 deg. No resisted movement with L sh./biceps. Grip strength: L: 66#, R: 131#.  Time  4    Period  Weeks    Status  Partially Met    Target Date  11/17/17      PT LONG TERM GOAL #3   Title  Pt. will progress L shoulder strength to grossly 4+/5 MMT to improve carrying/ lifting tasks.     Baseline  No strength testing/ resisted ex. at this time.     Time  4    Period  Weeks    Status  On-going    Target Date  11/17/17      PT LONG TERM GOAL #4   Title  Pt. able to return to fishing with no L shoulder/elbow pain or limitations.      Baseline  attempted and lasted 20 minutes (pain).      Time  4    Period  Weeks    Status  Not Met    Target Date  11/17/17              Patient will benefit from skilled therapeutic intervention in order to improve the following deficits and impairments:  Decreased mobility, Hypomobility, Decreased scar mobility, Decreased range of motion, Decreased activity tolerance, Decreased strength,  Pain, Postural dysfunction, Impaired UE functional use, Impaired flexibility  Visit Diagnosis: Chronic left shoulder pain  Muscle weakness (generalized)  Shoulder joint stiffness, left     Problem List Patient Active Problem List   Diagnosis Date Noted  . Chest pain 09/12/2014   Pura Spice, PT, DPT # [phone] 10/26/2017, 4:30 PM  [facility] [address] [place], [place], [address] Phone: [phone]   Fax:  [phone]  Name: Ethan Rosales MRN: [id number] Date of Birth: July 21, 1960

## 2017-10-27 ENCOUNTER — Ambulatory Visit: Payer: Medicaid Other | Attending: Orthopedic Surgery | Admitting: Physical Therapy

## 2017-10-27 ENCOUNTER — Encounter: Payer: Self-pay | Admitting: Physical Therapy

## 2017-10-27 DIAGNOSIS — M6281 Muscle weakness (generalized): Secondary | ICD-10-CM | POA: Diagnosis present

## 2017-10-27 DIAGNOSIS — M25512 Pain in left shoulder: Secondary | ICD-10-CM | POA: Insufficient documentation

## 2017-10-27 DIAGNOSIS — G8929 Other chronic pain: Secondary | ICD-10-CM | POA: Insufficient documentation

## 2017-10-27 DIAGNOSIS — M25612 Stiffness of left shoulder, not elsewhere classified: Secondary | ICD-10-CM | POA: Diagnosis present

## 2017-10-27 NOTE — Therapy (Addendum)
[facility] [address] [place], [place], [address] Phone: [phone]   Fax:  [phone]  Physical Therapy Treatment  Patient Details  Name: Ethan Rosales MRN: [id number] Date of Birth: [birth year] Referring Provider: Dr. Jeannie Fend   Encounter Date: 10/27/2017  PT End of Session - 10/27/17 1043    Visit Number  6    Number of Visits  8    Date for PT Re-Evaluation  11/17/17    PT Start Time  0905    PT Stop Time  0958    PT Time Calculation (min)  53 min    Activity Tolerance  Patient limited by pain    Behavior During Therapy  [facility] for tasks assessed/performed       Past Medical History:  Diagnosis Date  . Acid reflux   . Arthritis    "probably in my knees" (09/12/2014)  . Chronic lower back pain   . Complication of anesthesia    "anesthesia burndt my skin in ~ 04/2014 when I had colonoscopy"  . GERD (gastroesophageal reflux disease)   . Heart murmur    "when I was little"  . High cholesterol   . Lumbar herniated disc    "L3, 4, 5" (09/12/2014)  . Myocardial infarction ([place]) 2003  . Pneumonia    "once when I was real little"  . Refusal of blood transfusions as patient is Jehovah's Witness   . Shortness of breath    ' AT TIMES "  . Stroke [facility]) 2003   LEFT SIDE RESIDUAL (09/12/2014)  . Type II diabetes mellitus ([place])     Past Surgical History:  Procedure Laterality Date  . CARDIAC CATHETERIZATION  02/25/2013  . FOOT SURGERY Bilateral 1999   "took bones out of my toes so I could walk"  . LEFT HEART CATHETERIZATION WITH CORONARY ANGIOGRAM N/A 02/25/2013   Procedure: LEFT HEART CATHETERIZATION WITH CORONARY ANGIOGRAM;  Surgeon: Clent Demark, MD;  Location: [facility];  Service: Cardiovascular;  Laterality: N/A;    There were no vitals filed for this visit.  Subjective Assessment - 10/27/17 1038    Subjective  Pt f/u with MD unremarkable. Pt 6+ wks s/p L shld surgery. Pt reports that primary limitation is  painful tightness in anterior shoulder proximal to joint capsule. Seeking evaluation from Dr. Jeannie Fend for SI issues.    Pertinent History  SI joint fusion on L in 2017.  Pt. is filing for disability due to walking limitations.  Pt. enjoys fishing but unable to do right now due to L shoulder limitations.  Lives with 18 y/o father (had a stroke and dementia).        Limitations  Lifting;Writing;House hold activities    Patient Stated Goals  Increase L shoulder ROM/ strength.      Currently in Pain?  Yes    Pain Score  6     Pain Location  Shoulder    Pain Orientation  Left    Pain Descriptors / Indicators  Aching;Burning;Throbbing    Pain Type  Surgical pain    Pain Onset  More than a month ago    Pain Frequency  Constant    Multiple Pain Sites  Yes    Pain Score  8    Pain Location  Back    Pain Orientation  Left    Pain Descriptors / Indicators  Aching;Pressure;Throbbing        TREATMENT  Grade I shoulder mobilizations anterior, inferior, posterior 4  x 20 sec bouts each for pain control, joint lubrication "Floppy fish" joint distraction 3 x 20 sec bouts for pain control PROM stretching: abd, external rot, flexion, elbow ext. Pt demonstrates full elbow ext with very mild guarding at end range; other motions limited by strong guarding response Myofascial release to anterior shoulder, upper traps; pt tender to palpation and demonstrates increased tissue tension in anterior shoulder over coracoid process; clavicular stress testing r/o ligamentous etiology Pin and stretch to pectorals discontinued due to lack of pt response Contract/relax stretch into external rotation 6 x 5 sec isometric holds in pain-free range/force demonstrates mild improvement in ROM   Side-lying: Scapular PNF: rhythmic initiation with depression, retraction x 30 sec ea. Stabilizing reversals 5 x 5 sec hold with depression and retraction  Seated: Wand AAROM into external rotation, abduction, flexion x 20 ea.;  therapist cues to depress scapulae for improved mechanics. Increased pain with flexion; attempted into supine without improvement.  Ice x 10 min (unbilled)    PT Education - 10/27/17 1042    Education provided  Yes    Education Details  Posture, pain science, recovery post sx    Person(s) Educated  Patient    Methods  Explanation;Demonstration;Tactile cues    Comprehension  Verbalized understanding;Returned demonstration;Tactile cues required          PT Long Term Goals - 10/13/17 1018      PT LONG TERM GOAL #1   Title  Pt. will increase FOTO from 30 to 60 to improve pain-free mobility.      Baseline  FOTO baseline on 5/28: 30.  Today (6/18): 47    Time  4    Period  Weeks    Status  Partially Met    Target Date  11/17/17      PT LONG TERM GOAL #2   Title  Pt. will increase L shoulder AROM to WNL as compared to R shoulder to improve pain-free mobility.      Baseline  L shoulder AAROM flexion: 142 deg., abd.: 118 deg., ER: 64 deg., IR: 78 deg. No resisted movement with L sh./biceps. Grip strength: L: 66#, R: 131#.     Time  4    Period  Weeks    Status  Partially Met    Target Date  11/17/17      PT LONG TERM GOAL #3   Title  Pt. will progress L shoulder strength to grossly 4+/5 MMT to improve carrying/ lifting tasks.     Baseline  No strength testing/ resisted ex. at this time.     Time  4    Period  Weeks    Status  On-going    Target Date  11/17/17      PT LONG TERM GOAL #4   Title  Pt. able to return to fishing with no L shoulder/elbow pain or limitations.      Baseline  attempted and lasted 20 minutes (pain).      Time  4    Period  Weeks    Status  Not Met    Target Date  11/17/17            Plan - 10/27/17 1043    Clinical Impression Statement  Pt remains guarded and pain focused with full ROM only in shoulder internal rotation; c/c painful tightness in anterior shoulder over coracoid process, cause uncertain, relieved with extension. Pt tolerated  grade I mobilizations well with no evidence of capsular restriction. Full ROM in elbow  with very minor guarding at end range. Evidence of increasing dyskinesia with early scapular rise and forward shoulders prompted therapist to initiate scapular stabilization therex to supplement shoulder ROM focus. Pt tolerated well but demonstrated decreased ROM especially in flexion with increased pain response following exercises; therapist suspects that anterior tightness exacerbated by scapular therex. Pt will benefit from skilled physical therapy to reduce pain, improve ROM, and normalize shoulder biomechanics for return to PLOF.    Clinical Presentation  Evolving    Clinical Decision Making  Moderate    Rehab Potential  Fair    PT Frequency  1x / week    PT Duration  8 weeks    PT Treatment/Interventions  ADLs/Self Care Home Management;Moist Heat;Cryotherapy;Electrical Stimulation;Therapeutic activities;Functional mobility training;Therapeutic exercise;Neuromuscular re-education;Patient/family education;Manual techniques;Scar mobilization;Passive range of motion    PT Next Visit Plan  Progress HEP/ L shoulder ROM (follow MD protocol). Shoulder mobs as tol. Scapular stability.    Consulted and Agree with Plan of Care  Patient       Patient will benefit from skilled therapeutic intervention in order to improve the following deficits and impairments:  Decreased mobility, Hypomobility, Decreased scar mobility, Decreased range of motion, Decreased activity tolerance, Decreased strength, Pain, Postural dysfunction, Impaired UE functional use, Impaired flexibility  Visit Diagnosis: Chronic left shoulder pain  Muscle weakness (generalized)  Shoulder joint stiffness, left     Problem List Patient Active Problem List   Diagnosis Date Noted  . Chest pain 09/12/2014   Pura Spice, PT, DPT # [id number], SPT 10/27/2017, 10:59 AM  [facility] [address] [place], [place], [address] Phone: [phone]   Fax:  [phone]  Name: Costas Sena MRN: [id number] Date of Birth: Sep 09, 1960

## 2017-11-03 ENCOUNTER — Ambulatory Visit: Payer: Medicaid Other | Admitting: Physical Therapy

## 2017-11-03 ENCOUNTER — Encounter: Payer: Self-pay | Admitting: Physical Therapy

## 2017-11-03 DIAGNOSIS — M25512 Pain in left shoulder: Secondary | ICD-10-CM | POA: Diagnosis not present

## 2017-11-03 DIAGNOSIS — G8929 Other chronic pain: Secondary | ICD-10-CM

## 2017-11-03 DIAGNOSIS — M25612 Stiffness of left shoulder, not elsewhere classified: Secondary | ICD-10-CM

## 2017-11-03 DIAGNOSIS — M6281 Muscle weakness (generalized): Secondary | ICD-10-CM

## 2017-11-03 NOTE — Therapy (Signed)
[facility] [address] [place], [place], [address] Phone: [phone]   Fax:  [phone]  Physical Therapy Treatment  Patient Details  Name: Ethan Rosales MRN: [id number] Date of Birth: [birth year] Referring Provider: Dr. Jeannie Fend   Encounter Date: 11/03/2017  PT End of Session - 11/05/17 1053    Visit Number  7    Number of Visits  8    Date for PT Re-Evaluation  11/17/17    PT Start Time  1937    PT Stop Time  0939    PT Time Calculation (min)  55 min    Activity Tolerance  Patient limited by pain       Past Medical History:  Diagnosis Date  . Acid reflux   . Arthritis    "probably in my knees" (09/12/2014)  . Chronic lower back pain   . Complication of anesthesia    "anesthesia burndt my skin in ~ 04/2014 when I had colonoscopy"  . GERD (gastroesophageal reflux disease)   . Heart murmur    "when I was little"  . High cholesterol   . Lumbar herniated disc    "L3, 4, 5" (09/12/2014)  . Myocardial infarction ([place]) 2003  . Pneumonia    "once when I was real little"  . Refusal of blood transfusions as patient is Jehovah's Witness   . Shortness of breath    ' AT TIMES "  . Stroke [facility]) 2003   LEFT SIDE RESIDUAL (09/12/2014)  . Type II diabetes mellitus ([place])     Past Surgical History:  Procedure Laterality Date  . CARDIAC CATHETERIZATION  02/25/2013  . FOOT SURGERY Bilateral 1999   "took bones out of my toes so I could walk"  . LEFT HEART CATHETERIZATION WITH CORONARY ANGIOGRAM N/A 02/25/2013   Procedure: LEFT HEART CATHETERIZATION WITH CORONARY ANGIOGRAM;  Surgeon: Clent Demark, MD;  Location: [facility];  Service: Cardiovascular;  Laterality: N/A;    There were no vitals filed for this visit.    Pt. reports 5/10 L shoulder pain currently at rest but experienced 8/10 L shoulder "shooting" nerve pain this weekend.       TREATMENT  There.ex.:  B UBE 4x 2 min. F/b. Seated wand ex.: press-ups/  chest press 5x2 each (light PT AAROM).   Nautilus: 40# seated lat. Pull down/ 40# standing tricep extension/ 40# scap. Retraction/ 40# chest press 15x2.  (fatigue noted/ light PT feedback and assist with L sh. Movement patterns). Discussed/ reviewed HEP in depth.  Seated: Wand AAROM into external rotation, abduction, flexion x 20 ea.; therapist cues to depress scapulae for improved mechanics. Increased pain with flexion.      Manual:  Grade II-III shoulder mobilizations anterior, inferior, posterior 4 x 20 sec bouts each for pain control, joint lubrication AA/PROM stretching: abd, external rot, flexion, elbow ext. Pt demonstrates full elbow ext with very mild guarding at end range; other motions limited by strong guarding response Myofascial release to anterior shoulder, upper traps; pt tender to palpation and demonstrates increased tissue tension in anterior shoulder over coracoid process; clavicular stress testing r/o ligamentous etiology   Ice x 10 min (unbilled)    PT Long Term Goals - 10/13/17 1018      PT LONG TERM GOAL #1   Title  Pt. will increase FOTO from 30 to 60 to improve pain-free mobility.      Baseline  FOTO baseline on 5/28: 30.  Today (6/18): 47  Time  4    Period  Weeks    Status  Partially Met    Target Date  11/17/17      PT LONG TERM GOAL #2   Title  Pt. will increase L shoulder AROM to WNL as compared to R shoulder to improve pain-free mobility.      Baseline  L shoulder AAROM flexion: 142 deg., abd.: 118 deg., ER: 64 deg., IR: 78 deg. No resisted movement with L sh./biceps. Grip strength: L: 66#, R: 131#.     Time  4    Period  Weeks    Status  Partially Met    Target Date  11/17/17      PT LONG TERM GOAL #3   Title  Pt. will progress L shoulder strength to grossly 4+/5 MMT to improve carrying/ lifting tasks.     Baseline  No strength testing/ resisted ex. at this time.     Time  4    Period  Weeks    Status  On-going    Target Date  11/17/17       PT LONG TERM GOAL #4   Title  Pt. able to return to fishing with no L shoulder/elbow pain or limitations.      Baseline  attempted and lasted 20 minutes (pain).      Time  4    Period  Weeks    Status  Not Met    Target Date  11/17/17            Plan - 11/05/17 1131    Clinical Impression Statement  Pt. showing slow but consistent proress with L sh. ROM.  Pt. remains pain focused/ limited with sh. flexion/ abd. in seated/standing posture.  PT encouraging pt. to use arm more with everyday tasks.  Increase reps with resisted ex. today but limited with muscle fatigue/pain.      Clinical Presentation  Evolving    Clinical Decision Making  Moderate    Rehab Potential  Fair    PT Frequency  1x / week    PT Duration  8 weeks    PT Treatment/Interventions  ADLs/Self Care Home Management;Moist Heat;Cryotherapy;Electrical Stimulation;Therapeutic activities;Functional mobility training;Therapeutic exercise;Neuromuscular re-education;Patient/family education;Manual techniques;Scar mobilization;Passive range of motion    PT Next Visit Plan  Progress HEP/ L shoulder ROM (follow MD protocol). Shoulder mobs as tol. Scapular stability.    Consulted and Agree with Plan of Care  Patient       Patient will benefit from skilled therapeutic intervention in order to improve the following deficits and impairments:  Decreased mobility, Hypomobility, Decreased scar mobility, Decreased range of motion, Decreased activity tolerance, Decreased strength, Pain, Postural dysfunction, Impaired UE functional use, Impaired flexibility  Visit Diagnosis: Chronic left shoulder pain  Muscle weakness (generalized)  Shoulder joint stiffness, left     Problem List Patient Active Problem List   Diagnosis Date Noted  . Chest pain 09/12/2014   Pura Spice, PT, DPT # [phone] 11/06/2017, 1:33 PM  [facility] [address] [place], [place], [address] Phone:  [phone]   Fax:  [phone]  Name: Ethan Rosales MRN: [id number] Date of Birth: [birth year]

## 2017-11-10 ENCOUNTER — Ambulatory Visit: Payer: Medicaid Other | Admitting: Physical Therapy

## 2017-11-10 ENCOUNTER — Encounter: Payer: Self-pay | Admitting: Physical Therapy

## 2017-11-10 DIAGNOSIS — M25512 Pain in left shoulder: Principal | ICD-10-CM

## 2017-11-10 DIAGNOSIS — M6281 Muscle weakness (generalized): Secondary | ICD-10-CM

## 2017-11-10 DIAGNOSIS — M25612 Stiffness of left shoulder, not elsewhere classified: Secondary | ICD-10-CM

## 2017-11-10 DIAGNOSIS — G8929 Other chronic pain: Secondary | ICD-10-CM

## 2017-11-10 NOTE — Therapy (Signed)
[place] [address]. [place], [place], [address] Phone: [phone]   Fax:  [phone]  Physical Therapy Treatment  Patient Details  Name: Ethan Rosales MRN: [id number] Date of Birth: [birth year] Referring Provider: Dr. Jeannie Fend   Encounter Date: 11/10/2017  PT End of Session - 11/10/17 1001    Visit Number  8    Number of Visits  12    Date for PT Re-Evaluation  12/08/17    PT Start Time  0944    PT Stop Time  1039    PT Time Calculation (min)  55 min    Activity Tolerance  Patient limited by pain    Behavior During Therapy  [facility] for tasks assessed/performed       Past Medical History:  Diagnosis Date  . Acid reflux   . Arthritis    "probably in my knees" (09/12/2014)  . Chronic lower back pain   . Complication of anesthesia    "anesthesia burndt my skin in ~ 04/2014 when I had colonoscopy"  . GERD (gastroesophageal reflux disease)   . Heart murmur    "when I was little"  . High cholesterol   . Lumbar herniated disc    "L3, 4, 5" (09/12/2014)  . Myocardial infarction ([place]) 2003  . Pneumonia    "once when I was real little"  . Refusal of blood transfusions as patient is Jehovah's Witness   . Shortness of breath    ' AT TIMES "  . Stroke [facility]) 2003   LEFT SIDE RESIDUAL (09/12/2014)  . Type II diabetes mellitus ([place])     Past Surgical History:  Procedure Laterality Date  . CARDIAC CATHETERIZATION  02/25/2013  . FOOT SURGERY Bilateral 1999   "took bones out of my toes so I could walk"  . LEFT HEART CATHETERIZATION WITH CORONARY ANGIOGRAM N/A 02/25/2013   Procedure: LEFT HEART CATHETERIZATION WITH CORONARY ANGIOGRAM;  Surgeon: Clent Demark, MD;  Location: [facility] CATH [facility];  Service: Cardiovascular;  Laterality: N/A;    There were no vitals filed for this visit.  Subjective Assessment - 11/10/17 0948    Subjective  Pt. reports 5/10 L shoulder pain currently.  Pt. states L buttocks to knee was numb over the  weekend.  Pt. states the numbness may be due to riding in truck.       Pertinent History  SI joint fusion on L in 2017.  Pt. is filing for disability due to walking limitations.  Pt. enjoys fishing but unable to do right now due to L shoulder limitations.  Lives with 14 y/o father (had a stroke and dementia).        Limitations  Lifting;Writing;House hold activities    Patient Stated Goals  Increase L shoulder ROM/ strength.      Currently in Pain?  Yes    Pain Score  5     Pain Location  Shoulder    Pain Orientation  Left    Pain Descriptors / Indicators  Aching;Shooting    Pain Type  Surgical pain          TREATMENT  There.ex.:  B UBE 3 x 2 min. F/b. Seated wand ex.: press-ups/ chest press 5x2 each (light PT AAROM).   Nautilus: 50# seated lat. Pull down/ 50# standing tricep extension/ 20# B sh. Adduction with handles/ 50# scap. Retraction/ Discussed/ reviewed HEP in depth.  Seated: wt. Wand press-ups with light PT assist on L.   Wand AAROM into external  rotation, abduction, flexion x 20 ea.; therapist cues to depress scapulae for improved mechanics. Increased pain with flexion.      Manual:  AA/PROM stretching: abd, external rot, flexion, elbow ext. Pt demonstrates full elbow ext with very mild guarding at end range; other motions limited by strong guarding response Myofascial release to anterior shoulder, upper traps; pt tender to palpation and demonstrates increased tissue tension in anterior shoulder over coracoid process; clavicular stress testing r/o ligamentous etiology   Ice x 10 min (unbilled)    PT Long Term Goals - 11/10/17 1229      PT LONG TERM GOAL #1   Title  Pt. will increase FOTO from 30 to 60 to improve pain-free mobility.      Baseline  FOTO baseline on 5/28: 30.  Today (6/18): 47.  FOTO: 36 (regression over past several weeks today secondary to pain).      Time  4    Period  Weeks    Status  Partially Met    Target Date  12/08/17      PT LONG  TERM GOAL #2   Title  Pt. will increase L shoulder AROM to WNL as compared to R shoulder to improve pain-free mobility.      Baseline  Supine L shoulder AROM: flexion (123 deg.), abd. (113 deg.), ER (52 deg.), IR (64 deg.). Seated L shoulder flexion (98 deg.), abd. (84 deg.). Grip strength: R (134#), L (69#). Significant L anterior deltoid tenderness.     Time  4    Period  Weeks    Status  Partially Met    Target Date  12/08/17      PT LONG TERM GOAL #3   Title  Pt. will progress L shoulder strength to grossly 4+/5 MMT to improve carrying/ lifting tasks.     Baseline  L shoulder strength grossly 4/5 MMT in available range.     Time  4    Period  Weeks    Status  Not Met    Target Date  12/08/17      PT LONG TERM GOAL #4   Title  Pt. able to return to fishing with no L shoulder/elbow pain or limitations.      Baseline  attempted and lasted 20 minutes (pain).      Time  4    Period  Weeks    Status  Not Met    Target Date  12/08/17            Plan - 11/10/17 1002    Clinical Impression Statement  Supine L shoulder AROM:  flexion (123 deg.), abd. (113 deg.), ER (52 deg.), IR (64 deg.).  Seated L shoulder flexion (98 deg.), abd. (84 deg.).  Grip strength: R (134#), L (69#).  Significant L anterior deltoid tenderness.  L shoulder strength grossly 4/5 MMT in available range.  FOTO: 36 (slight regression from previous reassessment).  Pt. very pain focused/ limited today with L shoulder/ SI/ LE pain with all movement patterns.  Pt. requires extra time to stand from chair with heavy assist on [facility]  Pt. will continue to benefit from skilled PT services to improve L shoulder ROM/ strengthening to promote improve mobility with daily tasks.            Clinical Presentation  Evolving    Clinical Decision Making  Moderate    Rehab Potential  Fair    PT Frequency  1x / week    PT Duration  4  weeks    PT Treatment/Interventions  ADLs/Self Care Home Management;Moist Heat;Cryotherapy;Electrical  Stimulation;Therapeutic activities;Functional mobility training;Therapeutic exercise;Neuromuscular re-education;Patient/family education;Manual techniques;Scar mobilization;Passive range of motion    PT Next Visit Plan  Progress HEP/ L shoulder ROM (follow MD protocol). Shoulder mobs as tol. Scapular stability.       Patient will benefit from skilled therapeutic intervention in order to improve the following deficits and impairments:  Decreased mobility, Hypomobility, Decreased scar mobility, Decreased range of motion, Decreased activity tolerance, Decreased strength, Pain, Postural dysfunction, Impaired UE functional use, Impaired flexibility  Visit Diagnosis: Chronic left shoulder pain  Muscle weakness (generalized)  Shoulder joint stiffness, left     Problem List Patient Active Problem List   Diagnosis Date Noted  . Chest pain 09/12/2014   Pura Spice, PT, DPT # [phone] 11/10/2017, 12:33 PM  [place] [facility] [address] [place], [place], [address] Phone: [phone]   Fax:  [phone]  Name: Ethan Rosales MRN: [id number] Date of Birth: 11-30-1960

## 2017-11-17 ENCOUNTER — Ambulatory Visit: Payer: Medicaid Other | Admitting: Physical Therapy

## 2017-11-24 ENCOUNTER — Ambulatory Visit: Payer: Medicaid Other | Admitting: Physical Therapy

## 2017-12-01 ENCOUNTER — Ambulatory Visit: Payer: Medicaid Other | Attending: Orthopedic Surgery | Admitting: Physical Therapy

## 2017-12-01 ENCOUNTER — Encounter: Payer: Self-pay | Admitting: Physical Therapy

## 2017-12-01 DIAGNOSIS — G8929 Other chronic pain: Secondary | ICD-10-CM | POA: Insufficient documentation

## 2017-12-01 DIAGNOSIS — M25612 Stiffness of left shoulder, not elsewhere classified: Secondary | ICD-10-CM | POA: Diagnosis present

## 2017-12-01 DIAGNOSIS — M25512 Pain in left shoulder: Secondary | ICD-10-CM | POA: Insufficient documentation

## 2017-12-01 DIAGNOSIS — M6281 Muscle weakness (generalized): Secondary | ICD-10-CM | POA: Diagnosis present

## 2017-12-01 NOTE — Therapy (Signed)
[place] [facility] [address] [place], [place], [address] Phone: [phone]   Fax:  [phone]  Physical Therapy Treatment  Patient Details  Name: Ethan Rosales MRN: [id number] Date of Birth: 1960-07-19 Referring Provider: Dr. Jeannie Fend   Encounter Date: 12/01/2017  PT End of Session - 12/01/17 1243    Visit Number  9    Number of Visits  12    Date for PT Re-Evaluation  12/08/17    PT Start Time  0911    PT Stop Time  1004    PT Time Calculation (min)  53 min    Activity Tolerance  Patient limited by pain    Behavior During Therapy  [facility] for tasks assessed/performed       Past Medical History:  Diagnosis Date  . Acid reflux   . Arthritis    "probably in my knees" (09/12/2014)  . Chronic lower back pain   . Complication of anesthesia    "anesthesia burndt my skin in ~ 04/2014 when I had colonoscopy"  . GERD (gastroesophageal reflux disease)   . Heart murmur    "when I was little"  . High cholesterol   . Lumbar herniated disc    "L3, 4, 5" (09/12/2014)  . Myocardial infarction ([place]) 2003  . Pneumonia    "once when I was real little"  . Refusal of blood transfusions as patient is Jehovah's Witness   . Shortness of breath    ' AT TIMES "  . Stroke [facility]) 2003   LEFT SIDE RESIDUAL (09/12/2014)  . Type II diabetes mellitus ([place])     Past Surgical History:  Procedure Laterality Date  . CARDIAC CATHETERIZATION  02/25/2013  . FOOT SURGERY Bilateral 1999   "took bones out of my toes so I could walk"  . LEFT HEART CATHETERIZATION WITH CORONARY ANGIOGRAM N/A 02/25/2013   Procedure: LEFT HEART CATHETERIZATION WITH CORONARY ANGIOGRAM;  Surgeon: Clent Demark, MD;  Location: [place] CATH LAB;  Service: Cardiovascular;  Laterality: N/A;    There were no vitals filed for this visit.  Subjective Assessment - 12/01/17 0930    Subjective  Pt. c/o 8/10 L anterior shoulder/ L SI to gluteal region.  Pt. states he is scheduled for epidural  at L4-5, L5-S1.  Pt. remains frustrated with persistent pain in L hip/low back and returns to Dr. Jeannie Fend next Monday.  Pt. apologized for missing last 2 PT tx. sessions due to deaths in family.      Pertinent History  SI joint fusion on L in 2017.  Pt. is filing for disability due to walking limitations.  Pt. enjoys fishing but unable to do right now due to L shoulder limitations.  Lives with 43 y/o father (had a stroke and dementia).        Limitations  Lifting;Writing;House hold activities    Patient Stated Goals  Increase L shoulder ROM/ strength.      Currently in Pain?  Yes    Pain Score  8     Pain Location  Shoulder    Pain Orientation  Left    Pain Descriptors / Indicators  Aching    Pain Type  Surgical pain;Chronic pain    Pain Score  8    Pain Location  Back    Pain Orientation  Left    Pain Descriptors / Indicators  Aching          TREATMENT  There.ex.:  Supine wand ex.: press-ups/ chest press 5x2  each (light PT AAROM).  Nautilus: 50# seated lat. Pull down/ 50# standing tricep extension/ 50# scap. Retraction 30x Supine tricep extension (assist to maintain proper positioning)/ serratus punches/ B sh. horizontal abduction/ adduction 10x2.        No UBE today.     Manual:  AA/PROM stretching: abd, external rot, flexion, elbow ext.  Trigger point/ STM release techniques to L anterior shoulder/ UT musculature (as tolerated)- several episodes of sharp/increasing pain with increase pressure to trigger points. Reassessment of cervical AROM (all planes).   Ice x 10 min (unbilled)    PT Long Term Goals - 11/10/17 1229      PT LONG TERM GOAL #1   Title  Pt. will increase FOTO from 30 to 60 to improve pain-free mobility.      Baseline  FOTO baseline on 5/28: 30.  Today (6/18): 47.  FOTO: 36 (regression over past several weeks today secondary to pain).      Time  4    Period  Weeks    Status  Partially Met    Target Date  12/08/17      PT LONG TERM GOAL #2    Title  Pt. will increase L shoulder AROM to WNL as compared to R shoulder to improve pain-free mobility.      Baseline  Supine L shoulder AROM: flexion (123 deg.), abd. (113 deg.), ER (52 deg.), IR (64 deg.). Seated L shoulder flexion (98 deg.), abd. (84 deg.). Grip strength: R (134#), L (69#). Significant L anterior deltoid tenderness.     Time  4    Period  Weeks    Status  Partially Met    Target Date  12/08/17      PT LONG TERM GOAL #3   Title  Pt. will progress L shoulder strength to grossly 4+/5 MMT to improve carrying/ lifting tasks.     Baseline  L shoulder strength grossly 4/5 MMT in available range.     Time  4    Period  Weeks    Status  Not Met    Target Date  12/08/17      PT LONG TERM GOAL #4   Title  Pt. able to return to fishing with no L shoulder/elbow pain or limitations.      Baseline  attempted and lasted 20 minutes (pain).      Time  4    Period  Weeks    Status  Not Met    Target Date  12/08/17            Plan - 12/01/17 1244    Clinical Impression Statement  (+) L anterior deltoid/ upper trap. trigger points that increase pain with over pressure during manual tx. session.  Good cervical AROM all planes and PT instructed pt. in proper UT/levator stretches and STM to decrease tenderness/pain.  L shoulder AROM remains pain limited during flexion/ abduction movement patterns in all position.  PT has tried to focus on more independent L UE stretches to promote a greater focus on shoulder stability ex. in clinic.      Clinical Presentation  Evolving    Clinical Decision Making  Moderate    Rehab Potential  Fair    PT Frequency  1x / week    PT Duration  4 weeks    PT Treatment/Interventions  ADLs/Self Care Home Management;Moist Heat;Cryotherapy;Electrical Stimulation;Therapeutic activities;Functional mobility training;Therapeutic exercise;Neuromuscular re-education;Patient/family education;Manual techniques;Scar mobilization;Passive range of motion    PT Next  Visit Plan  Progress HEP/ L shoulder ROM (follow MD protocol). Shoulder mobs as tol. Scapular stability.  Discuss MD f/u and progress strengthening.         Patient will benefit from skilled therapeutic intervention in order to improve the following deficits and impairments:  Decreased mobility, Hypomobility, Decreased scar mobility, Decreased range of motion, Decreased activity tolerance, Decreased strength, Pain, Postural dysfunction, Impaired UE functional use, Impaired flexibility  Visit Diagnosis: Chronic left shoulder pain  Muscle weakness (generalized)  Shoulder joint stiffness, left     Problem List Patient Active Problem List   Diagnosis Date Noted  . Chest pain 09/12/2014   Pura Spice, PT, DPT # [phone] 12/01/2017, 12:57 PM  [facility] [address] [place], [place], [address] Phone: [phone]   Fax:  [phone]  Name: Ethan Rosales MRN: [id number] Date of Birth: [birth year]

## 2017-12-08 ENCOUNTER — Ambulatory Visit: Payer: Medicaid Other | Admitting: Physical Therapy

## 2017-12-08 ENCOUNTER — Encounter: Payer: Self-pay | Admitting: Physical Therapy

## 2017-12-08 DIAGNOSIS — M6281 Muscle weakness (generalized): Secondary | ICD-10-CM

## 2017-12-08 DIAGNOSIS — G8929 Other chronic pain: Secondary | ICD-10-CM

## 2017-12-08 DIAGNOSIS — M25512 Pain in left shoulder: Principal | ICD-10-CM

## 2017-12-08 DIAGNOSIS — M25612 Stiffness of left shoulder, not elsewhere classified: Secondary | ICD-10-CM

## 2017-12-11 NOTE — Therapy (Addendum)
[facility] [address] [place], [place], [address] Phone: [phone]   Fax:  [phone]  Physical Therapy Treatment  Patient Details  Name: Ethan Rosales MRN: [id number] Date of Birth: May 07, 1960 Referring Provider: Dr. Jeannie Fend   Encounter Date: 12/08/2017    Treatment: 10 of 12.  Recert date: 7/35/32    Past Medical History:  Diagnosis Date  . Acid reflux   . Arthritis    "probably in my knees" (09/12/2014)  . Chronic lower back pain   . Complication of anesthesia    "anesthesia burndt my skin in ~ 04/2014 when I had colonoscopy"  . GERD (gastroesophageal reflux disease)   . Heart murmur    "when I was little"  . High cholesterol   . Lumbar herniated disc    "L3, 4, 5" (09/12/2014)  . Myocardial infarction ([place]) 2003  . Pneumonia    "once when I was real little"  . Refusal of blood transfusions as patient is Jehovah's Witness   . Shortness of breath    ' AT TIMES "  . Stroke [facility]) 2003   LEFT SIDE RESIDUAL (09/12/2014)  . Type II diabetes mellitus ([place])     Past Surgical History:  Procedure Laterality Date  . CARDIAC CATHETERIZATION  02/25/2013  . FOOT SURGERY Bilateral 1999   "took bones out of my toes so I could walk"  . LEFT HEART CATHETERIZATION WITH CORONARY ANGIOGRAM N/A 02/25/2013   Procedure: LEFT HEART CATHETERIZATION WITH CORONARY ANGIOGRAM;  Surgeon: Clent Demark, MD;  Location: [place] CATH LAB;  Service: Cardiovascular;  Laterality: N/A;    There were no vitals filed for this visit.       Pt. reports MD appt. went okay and they set up an appt. with Dr. Merrilyn Puma for 9/16 to assess L sh. Pt. has epidural for L SI/back next week. Pt. reports 8/10 L SI/back pain and 9/10 L shoulder.        TREATMENT  Scifit L4-5 B UE/LE (warm-up/no charge).  There.ex.:  Standing shoulder AROM (all planes)- mirror and PT feedback (pain tolerable range).  Nautilus:50# seated lat. Pull down/50#  standing tricep extension/50# scap. Retraction 30x Supine tricep extension (assist to maintain proper positioning)/ serratus punches/ B sh. horizontal abduction/ adduction 10x2.       Manual:  Supine/ seated L shoulder AA/PROM stretching: abd, external rot, flexion, elbow ext.  Trigger point/ STM release techniques to L anterior shoulder/ UT musculature (as tolerated)- several episodes of sharp/increasing pain with increase pressure to trigger points.   Ice x 10 min (unbilled)    PT Long Term Goals - 11/10/17 1229      PT LONG TERM GOAL #1   Title  Pt. will increase FOTO from 30 to 60 to improve pain-free mobility.      Baseline  FOTO baseline on 5/28: 30.  Today (6/18): 47.  FOTO: 36 (regression over past several weeks today secondary to pain).      Time  4    Period  Weeks    Status  Partially Met    Target Date  12/08/17      PT LONG TERM GOAL #2   Title  Pt. will increase L shoulder AROM to WNL as compared to R shoulder to improve pain-free mobility.      Baseline  Supine L shoulder AROM: flexion (123 deg.), abd. (113 deg.), ER (52 deg.), IR (64 deg.). Seated L shoulder flexion (98 deg.), abd. (84 deg.). Grip strength:  R (134#), L (69#). Significant L anterior deltoid tenderness.     Time  4    Period  Weeks    Status  Partially Met    Target Date  12/08/17      PT LONG TERM GOAL #3   Title  Pt. will progress L shoulder strength to grossly 4+/5 MMT to improve carrying/ lifting tasks.     Baseline  L shoulder strength grossly 4/5 MMT in available range.     Time  4    Period  Weeks    Status  Not Met    Target Date  12/08/17      PT LONG TERM GOAL #4   Title  Pt. able to return to fishing with no L shoulder/elbow pain or limitations.      Baseline  attempted and lasted 20 minutes (pain).      Time  4    Period  Weeks    Status  Not Met    Target Date  12/08/17            Plan - 12/14/17 0844    Clinical Impression Statement  High levels of pain  reported t/o tx. session.  PT focusing on L shoulder/ UE strengthening with proper technique/ posture.  Tx. limited by persistent hip/back pain with all standing tasks/ Nautilus ex.  Pt. showing slow progress with increase in resisted tasks/ UE due to pain.  No change to HEP at this time.  Pt. is scheduled for injection in hip next week to control pain.      Clinical Presentation  Evolving    Clinical Decision Making  Moderate    Rehab Potential  Fair    PT Frequency  1x / week    PT Duration  4 weeks    PT Treatment/Interventions  ADLs/Self Care Home Management;Moist Heat;Cryotherapy;Electrical Stimulation;Therapeutic activities;Functional mobility training;Therapeutic exercise;Neuromuscular re-education;Patient/family education;Manual techniques;Scar mobilization;Passive range of motion    PT Next Visit Plan  Progress HEP/ L shoulder ROM (follow MD protocol). Shoulder mobs as tol. Scapular stability.  RECERT NEXT [place]       Patient will benefit from skilled therapeutic intervention in order to improve the following deficits and impairments:  Decreased mobility, Hypomobility, Decreased scar mobility, Decreased range of motion, Decreased activity tolerance, Decreased strength, Pain, Postural dysfunction, Impaired UE functional use, Impaired flexibility  Visit Diagnosis: Chronic left shoulder pain  Muscle weakness (generalized)  Shoulder joint stiffness, left     Problem List Patient Active Problem List   Diagnosis Date Noted  . Chest pain 09/12/2014   Pura Spice, PT, DPT # [phone] 12/14/2017, 8:56 AM  [facility] [address] [place], [place], [address] Phone: [phone]   Fax:  [phone]  Name: Ethan Rosales MRN: [id number] Date of Birth: 1960-11-19

## 2017-12-16 ENCOUNTER — Ambulatory Visit: Payer: Medicaid Other | Admitting: Physical Therapy

## 2017-12-16 DIAGNOSIS — M25512 Pain in left shoulder: Secondary | ICD-10-CM | POA: Diagnosis not present

## 2017-12-16 DIAGNOSIS — G8929 Other chronic pain: Secondary | ICD-10-CM

## 2017-12-16 DIAGNOSIS — M25612 Stiffness of left shoulder, not elsewhere classified: Secondary | ICD-10-CM

## 2017-12-16 DIAGNOSIS — M6281 Muscle weakness (generalized): Secondary | ICD-10-CM

## 2017-12-21 ENCOUNTER — Encounter: Payer: Self-pay | Admitting: Physical Therapy

## 2017-12-21 NOTE — Therapy (Signed)
[facility] [address] [place], [place], [address] Phone: [phone]   Fax:  [phone]  Physical Therapy Treatment  Patient Details  Name: Ethan Rosales MRN: [id number] Date of Birth: Aug 08, 1960 Referring Provider: Dr. Jeannie Fend   Encounter Date: 12/16/2017  PT End of Session - 12/21/17 1725    Visit Number  11    Number of Visits  15    Date for PT Re-Evaluation  01/13/18    PT Start Time  0941    PT Stop Time  1039    PT Time Calculation (min)  58 min    Activity Tolerance  Patient limited by pain    Behavior During Therapy  [facility] for tasks assessed/performed       Past Medical History:  Diagnosis Date  . Acid reflux   . Arthritis    "probably in my knees" (09/12/2014)  . Chronic lower back pain   . Complication of anesthesia    "anesthesia burndt my skin in ~ 04/2014 when I had colonoscopy"  . GERD (gastroesophageal reflux disease)   . Heart murmur    "when I was little"  . High cholesterol   . Lumbar herniated disc    "L3, 4, 5" (09/12/2014)  . Myocardial infarction ([place]) 2003  . Pneumonia    "once when I was real little"  . Refusal of blood transfusions as patient is Jehovah's Witness   . Shortness of breath    ' AT TIMES "  . Stroke [facility]) 2003   LEFT SIDE RESIDUAL (09/12/2014)  . Type II diabetes mellitus ([place])     Past Surgical History:  Procedure Laterality Date  . CARDIAC CATHETERIZATION  02/25/2013  . FOOT SURGERY Bilateral 1999   "took bones out of my toes so I could walk"  . LEFT HEART CATHETERIZATION WITH CORONARY ANGIOGRAM N/A 02/25/2013   Procedure: LEFT HEART CATHETERIZATION WITH CORONARY ANGIOGRAM;  Surgeon: Clent Demark, MD;  Location: [place] CATH LAB;  Service: Cardiovascular;  Laterality: N/A;    There were no vitals filed for this visit.  Subjective Assessment - 12/21/17 1712    Subjective  Pt. scheduled for lumbar injection in 2 days.  Pt. very pain limited with all standing/ walking  tasks and reports high levels of sh./ SI joint pain.  Pt. reports he is frustrated with persistent pain and not sleeping well.      Pertinent History  SI joint fusion on L in 2017.  Pt. is filing for disability due to walking limitations.  Pt. enjoys fishing but unable to do right now due to L shoulder limitations.  Lives with 49 y/o father (had a stroke and dementia).        Limitations  Lifting;Writing;House hold activities    Patient Stated Goals  Increase L shoulder ROM/ strength.      Currently in Pain?  Yes    Pain Score  9     Pain Location  Shoulder    Pain Orientation  Left    Pain Descriptors / Indicators  Aching    Pain Type  Chronic pain;Surgical pain    Pain Score  9    Pain Location  Back    Pain Orientation  Lower;Left    Pain Descriptors / Indicators  Aching    Pain Type  Chronic pain         [facility] PT Assessment - 12/21/17 0001      Assessment   Medical Diagnosis  S/p L  shoulder biceps tenodesis/ arthrocopy, L shoulder pain    Referring Provider  Dr. Jeannie Fend    Onset Date/Surgical Date  09/14/17      Prior Function   Level of Independence  Independent         TREATMENT  Scifit L5 B UE/LE (warm-up/no charge).  There.ex.:   No Nautilus today. Supine tricep extension (assist to maintain proper positioning)/ serratus punches/ B sh. horizontal abduction/ adduction/ flexion/ ER AROM 10x2. Supine L shoulder isometrics (all planes of movement)- 10x each with PT manual feedback.    Manual:  Supine/ seated L shoulder AA/PROM stretching: abd, external rot, flexion, elbow ext. Trigger point/ STM release techniques to L anterior shoulder/ UT musculature (as tolerated)- several episodes of sharp/increasing pain with increase pressure to trigger points.   Pt. Will ice at home.     PT Long Term Goals - 12/21/17 1733      PT LONG TERM GOAL #1   Title  Pt. will increase FOTO from 30 to 60 to improve pain-free mobility.      Baseline  FOTO  baseline on 5/28: 30.  Today (6/18): 47.  FOTO: 36 (regression over past several weeks today secondary to pain).      Time  4    Period  Weeks    Status  On-going    Target Date  01/13/18      PT LONG TERM GOAL #2   Title  Pt. will increase L shoulder AROM to WNL as compared to R shoulder to improve pain-free mobility.      Baseline  Supine L shoulder AROM: flexion (127 deg.)- pain, abd. (115 deg.), ER (52 deg.), IR (64 deg.). Seated L shoulder flexion (<100 deg.), abd. (<90 deg.). Grip strength: R (134#), L (72#).    Time  4    Period  Weeks    Status  Partially Met    Target Date  01/13/18      PT LONG TERM GOAL #3   Title  Pt. will progress L shoulder strength to grossly 4+/5 MMT to improve carrying/ lifting tasks.     Baseline  L shoulder strength grossly 4/5 MMT in available range.     Time  4    Period  Weeks    Status  Not Met    Target Date  01/13/18      PT LONG TERM GOAL #4   Title  Pt. able to return to fishing with no L shoulder/elbow pain or limitations.      Baseline  attempted and lasted 20 minutes (pain).      Time  4    Period  Weeks    Status  Not Met    Target Date  01/13/18        Plan - 12/21/17 1728    Clinical Impression Statement  Pt. remains limited with all standing UE ther.ex./ functional tasks due to L sh./ low back pain.  Pt. requires heavy use of SPC with all safe standing/ walking tasks in PT clinic.  PT tx. limited to supine position L shoulder strengthening/ isometric ex. and manual tx. to increase pain-free L sh. mobility.  Pt. will have injection in L hip/back prior to next tx. to decrease pain/ increase standing tolerance.  Supine L shoulder AROM: flexion (127 deg.)- pain, abd. (115 deg.), ER (52 deg.), IR (64 deg.). Seated L shoulder flexion (<100 deg.), abd. (<90 deg.). Grip strength: R (134#), L (72#). Significant L anterior deltoid tenderness remains present with  palpation.  L shoulder strength grossly 4/5 MMT in available range.  Pt. should  be doing better at this stage of MD protocol but limited by pain.  PT recommends continued skilled services to increase L sh. ROM/ strengthening to promote return to overhead reaching/ lifting tasks.     Clinical Presentation  Evolving    Clinical Decision Making  Moderate    Rehab Potential  Fair    PT Frequency  1x / week    PT Duration  4 weeks    PT Treatment/Interventions  ADLs/Self Care Home Management;Moist Heat;Cryotherapy;Electrical Stimulation;Therapeutic activities;Functional mobility training;Therapeutic exercise;Neuromuscular re-education;Patient/family education;Manual techniques;Scar mobilization;Passive range of motion    PT Next Visit Plan  Progress HEP/ L shoulder ROM (follow MD protocol). CHECK CAID AUTHORIZATION VISITS.       Patient will benefit from skilled therapeutic intervention in order to improve the following deficits and impairments:  Decreased mobility, Hypomobility, Decreased scar mobility, Decreased range of motion, Decreased activity tolerance, Decreased strength, Pain, Postural dysfunction, Impaired UE functional use, Impaired flexibility  Visit Diagnosis: Chronic left shoulder pain  Muscle weakness (generalized)  Shoulder joint stiffness, left     Problem List Patient Active Problem List   Diagnosis Date Noted  . Chest pain 09/12/2014   Pura Spice, PT, DPT # [phone] 12/21/2017, 5:35 PM   [facility] [address] [place], [place], [address] Phone: [phone]   Fax:  [phone]  Name: Ethan Rosales MRN: [id number] Date of Birth: 10-06-1960

## 2017-12-22 ENCOUNTER — Ambulatory Visit: Payer: Medicaid Other | Admitting: Physical Therapy

## 2017-12-22 ENCOUNTER — Encounter: Payer: Self-pay | Admitting: Physical Therapy

## 2017-12-22 DIAGNOSIS — M6281 Muscle weakness (generalized): Secondary | ICD-10-CM

## 2017-12-22 DIAGNOSIS — M25512 Pain in left shoulder: Principal | ICD-10-CM

## 2017-12-22 DIAGNOSIS — G8929 Other chronic pain: Secondary | ICD-10-CM

## 2017-12-22 DIAGNOSIS — M25612 Stiffness of left shoulder, not elsewhere classified: Secondary | ICD-10-CM

## 2017-12-22 NOTE — Therapy (Signed)
[facility] [address] [place], [place], [address] Phone: [phone]   Fax:  [phone]  Physical Therapy Treatment  Patient Details  Name: Ethan Rosales MRN: [id number] Date of Birth: [birth year] Referring Provider: Dr. Jeannie Fend   Encounter Date: 12/22/2017    Treatment: 12 of 15.  Recert: 0/62/69   Past Medical History:  Diagnosis Date  . Acid reflux   . Arthritis    "probably in my knees" (09/12/2014)  . Chronic lower back pain   . Complication of anesthesia    "anesthesia burndt my skin in ~ 04/2014 when I had colonoscopy"  . GERD (gastroesophageal reflux disease)   . Heart murmur    "when I was little"  . High cholesterol   . Lumbar herniated disc    "L3, 4, 5" (09/12/2014)  . Myocardial infarction ([place]) 2003  . Pneumonia    "once when I was real little"  . Refusal of blood transfusions as patient is Jehovah's Witness   . Shortness of breath    ' AT TIMES "  . Stroke [facility]) 2003   LEFT SIDE RESIDUAL (09/12/2014)  . Type II diabetes mellitus ([place])     Past Surgical History:  Procedure Laterality Date  . CARDIAC CATHETERIZATION  02/25/2013  . FOOT SURGERY Bilateral 1999   "took bones out of my toes so I could walk"  . LEFT HEART CATHETERIZATION WITH CORONARY ANGIOGRAM N/A 02/25/2013   Procedure: LEFT HEART CATHETERIZATION WITH CORONARY ANGIOGRAM;  Surgeon: Clent Demark, MD;  Location: [place] CATH LAB;  Service: Cardiovascular;  Laterality: N/A;    There were no vitals filed for this visit.      Pt. reports that injection on Friday did not help at all and was painful. Pt. reports weakness in L UE and that he was unable to grip a small [employer] cup in the drive through this AM without the assistance of the R UE.      Treatment   Manual:  SupineL shoulderAA/PROM stretching: abd, external rot, flexion, elbow ext. Trigger point/ STM release techniques to L anterior shoulder/ UT musculature (as  tolerated)- several episodes of sharp/increasing pain with increase pressure to trigger points.    There Ex:  Nautilus:  40# Lat Pull Down x10 40# Tricep Ext. x10 40# Scapular Retraction x20 40# Chest Press x20 40# B sh. Adduction x10 20# Internal Rotation x15 20# External Rotation x15        Pt. is still very pain focused, localized near incision site on anterior deltoid. Pt. was more guarded today than in previous positions during start of manual therapy, however relaxed enough to get greater PROM. Pt. still lacks functional ROM in order to do ADLs. Pt. still struggles with forward flexion higher than 90 deg. Pt. performed well with Nautilus machine, even achieving greater ROM than when actively performing in supine/seated. Pt. experienced quivering of muscles during certrain exercises, demonstrating a decreased weakness in shoulder.     PT Long Term Goals - 12/21/17 1733      PT LONG TERM GOAL #1   Title  Pt. will increase FOTO from 30 to 60 to improve pain-free mobility.      Baseline  FOTO baseline on 5/28: 30.  Today (6/18): 47.  FOTO: 36 (regression over past several weeks today secondary to pain).      Time  4    Period  Weeks    Status  On-going    Target Date  01/13/18  PT LONG TERM GOAL #2   Title  Pt. will increase L shoulder AROM to WNL as compared to R shoulder to improve pain-free mobility.      Baseline  Supine L shoulder AROM: flexion (127 deg.)- pain, abd. (115 deg.), ER (52 deg.), IR (64 deg.). Seated L shoulder flexion (<100 deg.), abd. (<90 deg.). Grip strength: R (134#), L (72#).    Time  4    Period  Weeks    Status  Partially Met    Target Date  01/13/18      PT LONG TERM GOAL #3   Title  Pt. will progress L shoulder strength to grossly 4+/5 MMT to improve carrying/ lifting tasks.     Baseline  L shoulder strength grossly 4/5 MMT in available range.     Time  4    Period  Weeks    Status  Not Met    Target Date  01/13/18      PT LONG TERM  GOAL #4   Title  Pt. able to return to fishing with no L shoulder/elbow pain or limitations.      Baseline  attempted and lasted 20 minutes (pain).      Time  4    Period  Weeks    Status  Not Met    Target Date  01/13/18         Patient will benefit from skilled therapeutic intervention in order to improve the following deficits and impairments:  Decreased mobility, Hypomobility, Decreased scar mobility, Decreased range of motion, Decreased activity tolerance, Decreased strength, Pain, Postural dysfunction, Impaired UE functional use, Impaired flexibility  Visit Diagnosis: Chronic left shoulder pain  Muscle weakness (generalized)  Shoulder joint stiffness, left     Problem List Patient Active Problem List   Diagnosis Date Noted  . Chest pain 09/12/2014   Pura Spice, PT, DPT # [id number] Gwenlyn Saran, SPT 12/24/2017, 8:49 AM  [facility] [address] [place], [place], [address] Phone: [phone]   Fax:  [phone]  Name: Stoney Karczewski MRN: [id number] Date of Birth: [birth year]

## 2017-12-29 ENCOUNTER — Encounter: Payer: Self-pay | Admitting: Physical Therapy

## 2017-12-29 ENCOUNTER — Ambulatory Visit: Payer: Medicaid Other | Attending: Orthopedic Surgery | Admitting: Physical Therapy

## 2017-12-29 DIAGNOSIS — G8929 Other chronic pain: Secondary | ICD-10-CM

## 2017-12-29 DIAGNOSIS — M6281 Muscle weakness (generalized): Secondary | ICD-10-CM | POA: Diagnosis present

## 2017-12-29 DIAGNOSIS — M25512 Pain in left shoulder: Secondary | ICD-10-CM | POA: Insufficient documentation

## 2017-12-29 DIAGNOSIS — M25612 Stiffness of left shoulder, not elsewhere classified: Secondary | ICD-10-CM | POA: Insufficient documentation

## 2017-12-29 NOTE — Therapy (Signed)
[facility] [address] [place], [place], [address] Phone: [phone]   Fax:  [phone]  Physical Therapy Treatment  Patient Details  Name: Ethan Rosales MRN: [id number] Date of Birth: 17-Jul-1960 Referring Provider: Dr. Jeannie Fend   Encounter Date: 12/29/2017  PT End of Session - 12/29/17 0837    Visit Number  13    Number of Visits  15    Date for PT Re-Evaluation  01/13/18    PT Start Time  0732    PT Stop Time  0824    PT Time Calculation (min)  52 min    Activity Tolerance  Patient limited by pain    Behavior During Therapy  [facility] for tasks assessed/performed       Past Medical History:  Diagnosis Date  . Acid reflux   . Arthritis    "probably in my knees" (09/12/2014)  . Chronic lower back pain   . Complication of anesthesia    "anesthesia burndt my skin in ~ 04/2014 when I had colonoscopy"  . GERD (gastroesophageal reflux disease)   . Heart murmur    "when I was little"  . High cholesterol   . Lumbar herniated disc    "L3, 4, 5" (09/12/2014)  . Myocardial infarction ([place]) 2003  . Pneumonia    "once when I was real little"  . Refusal of blood transfusions as patient is Jehovah's Witness   . Shortness of breath    ' AT TIMES "  . Stroke [facility]) 2003   LEFT SIDE RESIDUAL (09/12/2014)  . Type II diabetes mellitus ([place])     Past Surgical History:  Procedure Laterality Date  . CARDIAC CATHETERIZATION  02/25/2013  . FOOT SURGERY Bilateral 1999   "took bones out of my toes so I could walk"  . LEFT HEART CATHETERIZATION WITH CORONARY ANGIOGRAM N/A 02/25/2013   Procedure: LEFT HEART CATHETERIZATION WITH CORONARY ANGIOGRAM;  Surgeon: Clent Demark, MD;  Location: [place] CATH LAB;  Service: Cardiovascular;  Laterality: N/A;    There were no vitals filed for this visit.  Subjective Assessment - 12/29/17 0832    Subjective  Pt. reports to have had a rough weekend and was unable to do much.  Pt. reports he was close to  going to ED over the weekend due to pain in L shoulder.      Pertinent History  SI joint fusion on L in 2017.  Pt. is filing for disability due to walking limitations.  Pt. enjoys fishing but unable to do right now due to L shoulder limitations.  Lives with 24 y/o father (had a stroke and dementia).        Limitations  Lifting;Writing;House hold activities    Patient Stated Goals  Increase L shoulder ROM/ strength.      Currently in Pain?  Yes    Pain Score  9     Pain Location  Shoulder    Pain Orientation  Left    Pain Descriptors / Indicators  Aching    Pain Type  Chronic pain;Surgical pain    Pain Onset  More than a month ago           Treatment   Manual:  Supine L shoulderAA/PROM stretching: abd, external rot, flexion, elbow ext. Supine L shoulder AP Grade I--II joint mobs to tolerance 30 sec x multiple bouts Prone L shoulder PA Grade I-II joint mobs to tolerance 30 sec x multiple bouts   There Ex:  Supine  AAROM with weighted bar (flexion, chest press) x15 Prone I's, T's x15 with overpressure x15         PT Long Term Goals - 12/21/17 1733      PT LONG TERM GOAL #1   Title  Pt. will increase FOTO from 30 to 60 to improve pain-free mobility.      Baseline  FOTO baseline on 5/28: 30.  Today (6/18): 47.  FOTO: 36 (regression over past several weeks today secondary to pain).      Time  4    Period  Weeks    Status  On-going    Target Date  01/13/18      PT LONG TERM GOAL #2   Title  Pt. will increase L shoulder AROM to WNL as compared to R shoulder to improve pain-free mobility.      Baseline  Supine L shoulder AROM: flexion (127 deg.)- pain, abd. (115 deg.), ER (52 deg.), IR (64 deg.). Seated L shoulder flexion (<100 deg.), abd. (<90 deg.). Grip strength: R (134#), L (72#).    Time  4    Period  Weeks    Status  Partially Met    Target Date  01/13/18      PT LONG TERM GOAL #3   Title  Pt. will progress L shoulder strength to grossly 4+/5 MMT to improve  carrying/ lifting tasks.     Baseline  L shoulder strength grossly 4/5 MMT in available range.     Time  4    Period  Weeks    Status  Not Met    Target Date  01/13/18      PT LONG TERM GOAL #4   Title  Pt. able to return to fishing with no L shoulder/elbow pain or limitations.      Baseline  attempted and lasted 20 minutes (pain).      Time  4    Period  Weeks    Status  Not Met    Target Date  01/13/18            Plan - 12/29/17 0837    Clinical Impression Statement  Pt. tolerated manual therapy today with slight increase in pain.  Pt. progressed with ther ex and ability to perform prone I's and T's.  Pt. continues to be very pain focused, however is able to perform exercises with minA. for full ROM.  Pt. is scheduled to have cortisone injection in shoulder on Thursday.  Pt. will continue to benefit from strength-based exercises in order to increase ROM with pain-free motion.      Clinical Presentation  Evolving    Clinical Decision Making  Moderate    Rehab Potential  Fair    PT Frequency  1x / week    PT Duration  4 weeks    PT Treatment/Interventions  ADLs/Self Care Home Management;Moist Heat;Cryotherapy;Electrical Stimulation;Therapeutic activities;Functional mobility training;Therapeutic exercise;Neuromuscular re-education;Patient/family education;Manual techniques;Scar mobilization;Passive range of motion    PT Next Visit Plan  Progress HEP/ L shoulder ROM (follow MD protocol). Discuss results of sh. injection       Patient will benefit from skilled therapeutic intervention in order to improve the following deficits and impairments:  Decreased mobility, Hypomobility, Decreased scar mobility, Decreased range of motion, Decreased activity tolerance, Decreased strength, Pain, Postural dysfunction, Impaired UE functional use, Impaired flexibility  Visit Diagnosis: Chronic left shoulder pain  Muscle weakness (generalized)  Shoulder joint stiffness, left     Problem  List Patient Active Problem List  Diagnosis Date Noted  . Chest pain 09/12/2014   Pura Spice, PT, DPT # [phone] [place], SPT 12/29/2017, 5:27 PM  [facility] [address] [place], [place], [address] Phone: [phone]   Fax:  [phone]  Name: Savas Elvin MRN: [id number] Date of Birth: [birth year]

## 2018-01-05 ENCOUNTER — Ambulatory Visit: Payer: Medicaid Other | Admitting: Physical Therapy

## 2018-01-05 ENCOUNTER — Encounter: Payer: Self-pay | Admitting: Physical Therapy

## 2018-01-05 DIAGNOSIS — M25512 Pain in left shoulder: Secondary | ICD-10-CM | POA: Diagnosis not present

## 2018-01-05 DIAGNOSIS — M25612 Stiffness of left shoulder, not elsewhere classified: Secondary | ICD-10-CM

## 2018-01-05 DIAGNOSIS — G8929 Other chronic pain: Secondary | ICD-10-CM

## 2018-01-05 DIAGNOSIS — M6281 Muscle weakness (generalized): Secondary | ICD-10-CM

## 2018-01-05 NOTE — Patient Instructions (Signed)
Access Code: [email or web address]  URL: [email or web address]  Date: 01/05/2018  Prepared by: Dorene Grebe   Exercises  Doorway Pec Stretch at 90 Degrees Abduction - 3 reps - 1 sets - 20 hold - 1x daily - 7x weekly  Seated Cervical Sidebending Stretch - 3 reps - 1 sets - 20 hold - 1x daily - 7x weekly  Seated Levator Scapulae Stretch - 3 reps - 1 sets - 20 hold - 1x daily - 7x weekly

## 2018-01-05 NOTE — Therapy (Signed)
[facility] [address] [place], [place], [address] Phone: [phone]   Fax:  [phone]  Physical Therapy Treatment  Patient Details  Name: Ethan Rosales MRN: [id number] Date of Birth: 10/29/1960 Referring Provider: Dr. Creighton   Encounter Date: 01/05/2018  PT End of Session - 01/05/18 0739    Visit Number  14    Number of Visits  15    Date for PT Re-Evaluation  01/13/18    PT Start Time  0731    PT Stop Time  0823    PT Time Calculation (min)  52 min    Activity Tolerance  Patient limited by pain    Behavior During Therapy  WFL for tasks assessed/performed       Past Medical History:  Diagnosis Date  . Acid reflux   . Arthritis    "probably in my knees" (09/12/2014)  . Chronic lower back pain   . Complication of anesthesia    "anesthesia burndt my skin in ~ 04/2014 when I had colonoscopy"  . GERD (gastroesophageal reflux disease)   . Heart murmur    "when I was little"  . High cholesterol   . Lumbar herniated disc    "L3, 4, 5" (09/12/2014)  . Myocardial infarction (HCC) 2003  . Pneumonia    "once when I was real little"  . Refusal of blood transfusions as patient is Jehovah's Witness   . Shortness of breath    ' AT TIMES "  . Stroke (HCC) 2003   LEFT SIDE RESIDUAL (09/12/2014)  . Type II diabetes mellitus (HCC)     Past Surgical History:  Procedure Laterality Date  . CARDIAC CATHETERIZATION  02/25/2013  . FOOT SURGERY Bilateral 1999   "took bones out of my toes so I could walk"  . LEFT HEART CATHETERIZATION WITH CORONARY ANGIOGRAM N/A 02/25/2013   Procedure: LEFT HEART CATHETERIZATION WITH CORONARY ANGIOGRAM;  Surgeon: Mohan N Harwani, MD;  Location: [facility]ATH [facility];  Service: Cardiovascular;  Laterality: N/A;    There were no vitals filed for this visit.  Subjective Assessment - 01/05/18 0735    Subjective  Pt. reports injection went ok.  Pt. reports MD thought it was really tight, but not locked.  Pt.  states MD put the maximum injection amount in posterior portion of capsule.    Pertinent History  SI joint fusion on L in 2017.  Pt. is filing for disability due to walking limitations.  Pt. enjoys fishing but unable to do right now due to L shoulder limitations.  Lives with 55 y/o father (had a stroke and dementia).        Limitations  Lifting;Writing;House hold activities    Patient Stated Goals  Increase L shoulder ROM/ strength.      Currently in Pain?  Yes    Pain Score  6     Pain Location  Shoulder    Pain Orientation  Left    Pain Descriptors / Indicators  Aching    Pain Type  Chronic pain;Surgical pain    Pain Onset  More than a month ago    Pain Frequency  Constant    Pain Score  10    Pain Location  Back    Pain Orientation  Left;Lower    Pain Descriptors / Indicators  Aching    Pain Type  Chronic pain    Pain Onset  More than a month ago              Treatment   Manual:  Supine L shoulderAA/PROM stretching: abd, external rot, flexion, elbow ext. Supine L shoulder Grade II-III mobs (AP, PA, and inf) 30 sec x multiple bouts Supine Generalized Stretch to Cervical Musculature (UT/Levator) 30 sec x multiple bouts  Pt. Educated on new HEP including stretches to pecs/UT/Levator     There Ex:  Nautilus:  40# Lat Pull Down x15 40# Tricep Ext. x15 40# Scapular Retraction 2x15 40# Chest Press 2x15 40# B sh. Adduction 2x15 20# Internal Rotation x15 20# External Rotation x15           PT Long Term Goals - 12/21/17 1733      PT LONG TERM GOAL #1   Title  Pt. will increase FOTO from 30 to 60 to improve pain-free mobility.      Baseline  FOTO baseline on 5/28: 30.  Today (6/18): 47.  FOTO: 36 (regression over past several weeks today secondary to pain).      Time  4    Period  Weeks    Status  On-going    Target Date  01/13/18      PT LONG TERM GOAL #2   Title  Pt. will increase L shoulder AROM to WNL as compared to R shoulder to improve pain-free  mobility.      Baseline  Supine L shoulder AROM: flexion (127 deg.)- pain, abd. (115 deg.), ER (52 deg.), IR (64 deg.). Seated L shoulder flexion (<100 deg.), abd. (<90 deg.). Grip strength: R (134#), L (72#).    Time  4    Period  Weeks    Status  Partially Met    Target Date  01/13/18      PT LONG TERM GOAL #3   Title  Pt. will progress L shoulder strength to grossly 4+/5 MMT to improve carrying/ lifting tasks.     Baseline  L shoulder strength grossly 4/5 MMT in available range.     Time  4    Period  Weeks    Status  Not Met    Target Date  01/13/18      PT LONG TERM GOAL #4   Title  Pt. able to return to fishing with no L shoulder/elbow pain or limitations.      Baseline  attempted and lasted 20 minutes (pain).      Time  4    Period  Weeks    Status  Not Met    Target Date  01/13/18            Plan - 01/05/18 0741    Clinical Impression Statement  Pt. made significant improvements from last treatment sesion to today.  Pt. tolerated strength-based exercises on Nautilus mucher better and was able to increase the total amount of reps.  Pt. still experiences increased pain with ER, however was able to finish set of weights.  Pt. educated on importance of movement while arm is feeling better.  Pt. agreeable to additional stretches in HEP.  Pt. encouraged to continue with gym based exercises away from clinic.    Clinical Presentation  Evolving    Clinical Decision Making  Moderate    Rehab Potential  Fair    PT Frequency  1x / week    PT Duration  4 weeks    PT Treatment/Interventions  ADLs/Self Care Home Management;Moist Heat;Cryotherapy;Electrical Stimulation;Therapeutic activities;Functional mobility training;Therapeutic exercise;Neuromuscular re-education;Patient/family education;Manual techniques;Scar mobilization;Passive range of motion    PT Next Visit Plan  GOAL REASSESSMENT/ RECERT  PT Home Exercise Plan  see notes.       Patient will benefit from skilled  therapeutic intervention in order to improve the following deficits and impairments:  Decreased mobility, Hypomobility, Decreased scar mobility, Decreased range of motion, Decreased activity tolerance, Decreased strength, Pain, Postural dysfunction, Impaired UE functional use, Impaired flexibility  Visit Diagnosis: Chronic left shoulder pain  Muscle weakness (generalized)  Shoulder joint stiffness, left     Problem List Patient Active Problem List   Diagnosis Date Noted  . Chest pain 09/12/2014   Michael C Sherk, PT, DPT # [id number] Joshua Robbins, SPT 01/05/2018, 8:55 AM  [facility] [address] [place], [place], [address] Phone: [phone]   Fax:  [phone]  Name: Branch Hedglin MRN: [id number] Date of Birth: 10/28/1960   

## 2018-01-11 ENCOUNTER — Ambulatory Visit: Payer: Medicaid Other | Admitting: Physical Therapy

## 2018-01-11 ENCOUNTER — Encounter: Payer: Self-pay | Admitting: Physical Therapy

## 2018-01-11 DIAGNOSIS — G8929 Other chronic pain: Secondary | ICD-10-CM

## 2018-01-11 DIAGNOSIS — M25512 Pain in left shoulder: Secondary | ICD-10-CM | POA: Diagnosis not present

## 2018-01-11 DIAGNOSIS — M6281 Muscle weakness (generalized): Secondary | ICD-10-CM

## 2018-01-11 DIAGNOSIS — M25612 Stiffness of left shoulder, not elsewhere classified: Secondary | ICD-10-CM

## 2018-01-11 NOTE — Therapy (Signed)
[place] [facility] [address] [place], [place], [address] Phone: [phone]   Fax:  [phone]  Physical Therapy Treatment  Patient Details  Name: Ethan Rosales MRN: [id number] Date of Birth: 1960/04/30 Referring Provider: Dr. Jeannie Fend   Encounter Date: 01/11/2018  PT End of Session - 01/11/18 0730    Visit Number  15    Number of Visits  21    Date for PT Re-Evaluation  02/22/18    PT Start Time  0720    PT Stop Time  0831    PT Time Calculation (min)  71 min    Activity Tolerance  Patient limited by pain    Behavior During Therapy  [facility] for tasks assessed/performed       Past Medical History:  Diagnosis Date  . Acid reflux   . Arthritis    "probably in my knees" (09/12/2014)  . Chronic lower back pain   . Complication of anesthesia    "anesthesia burndt my skin in ~ 04/2014 when I had colonoscopy"  . GERD (gastroesophageal reflux disease)   . Heart murmur    "when I was little"  . High cholesterol   . Lumbar herniated disc    "L3, 4, 5" (09/12/2014)  . Myocardial infarction ([place]) 2003  . Pneumonia    "once when I was real little"  . Refusal of blood transfusions as patient is Jehovah's Witness   . Shortness of breath    ' AT TIMES "  . Stroke [facility]) 2003   LEFT SIDE RESIDUAL (09/12/2014)  . Type II diabetes mellitus ([place])     Past Surgical History:  Procedure Laterality Date  . CARDIAC CATHETERIZATION  02/25/2013  . FOOT SURGERY Bilateral 1999   "took bones out of my toes so I could walk"  . LEFT HEART CATHETERIZATION WITH CORONARY ANGIOGRAM N/A 02/25/2013   Procedure: LEFT HEART CATHETERIZATION WITH CORONARY ANGIOGRAM;  Surgeon: Clent Demark, MD;  Location: [place] [facility];  Service: Cardiovascular;  Laterality: N/A;    There were no vitals filed for this visit.  Subjective Assessment - 01/11/18 0729    Subjective  Pt. reports 6-7/10 L shoulder pain prior to PT tx. session.  Pt. reports having a difficult  weekend secondary to back pain.  Pt. states he is taking 2 anti-depressant medications and he reports he is not depressed.  Pt. reports meds. make him sleep for long periods of time and pt. slept 16 hours the other night (felt groggy).      Pertinent History  SI joint fusion on L in 2017.  Pt. is filing for disability due to walking limitations.  Pt. enjoys fishing but unable to do right now due to L shoulder limitations.  Lives with 2 y/o father (had a stroke and dementia).        Limitations  Lifting;Writing;House hold activities    Patient Stated Goals  Increase L shoulder ROM/ strength.      Currently in Pain?  Yes    Pain Score  6     Pain Location  Shoulder    Pain Orientation  Left    Pain Descriptors / Indicators  Aching    Pain Type  Chronic pain;Surgical pain    Pain Score  9    Pain Location  Back    Pain Orientation  Left;Lower    Pain Descriptors / Indicators  Aching    Pain Type  Chronic pain  Treatment   Manual:  Supine L shoulderAA/PROM stretching: abd, external rot, flexion, elbow ext. Supine L shoulder Grade II-III mobs (AP, PA, and inf) 30 sec x multiple bouts STM to L shoulder musculature/ pec/ biceps   There Ex:  Nautilus: 60# Lat Pull Down (15x2 with all ex.). 40# Tricep Ext. 40# B sh. Adduction with handles 50# sh. Ext. With wand 50# Scapular Retraction  50# Chest Press   Supine L shoulder manual isometrics with IR/ER 10x 5 sec. Holds (moderate resistance).   Standing shoulder wand ex. At mirror to reassess AROM.        PT Long Term Goals - 01/11/18 0731      PT LONG TERM GOAL #1   Title  Pt. will increase FOTO from 30 to 60 to improve pain-free mobility.      Baseline  FOTO baseline on 5/28: 30.  Today (6/18): 47.  FOTO: 36 (regression over past several weeks today secondary to pain).  9/16: 44 today (goal of 60)    Time  6    Period  Weeks    Status  Partially Met    Target Date  02/22/18      PT LONG TERM GOAL #2    Title  Pt. will increase L shoulder AROM to WNL as compared to R shoulder to improve pain-free mobility.      Baseline  Supine L shoulder AROM: flexion (130 deg.)- pain, abd. (112 deg.)- slight regression since last visit, ER (60 deg.), IR (69 deg.).  Seated L shoulder flexion (109 deg.)- pain, abd. (98 deg.).  Grip strength: R (117#), L (64#)- regression since last reassessment.    Time  6    Period  Weeks    Status  Partially Met    Target Date  02/22/18      PT LONG TERM GOAL #3   Title  Pt. will progress L shoulder strength to grossly 4+/5 MMT to improve carrying/ lifting tasks.     Baseline  L shoulder strength grossly 4/5 MMT in available range.     Time  6    Period  Weeks    Status  Not Met    Target Date  02/22/18      PT LONG TERM GOAL #4   Title  Pt. able to return to fishing with no L shoulder/elbow pain or limitations.      Baseline  attempted and lasted 20 minutes (pain).      Time  6    Period  Weeks    Status  On-going    Target Date  02/22/18         Plan - 01/11/18 1019    Clinical Impression Statement  Pt. remains pain focused/ limited with all aspects of L shoulder AROM in all positions.  Resisted ex. has improved with Nautilus ex. but MMT remains similar to last reassessment secondary to pain.  Pt. has L anterior shoulder shoulder tenderness with palpation and reports a band of pain with sh. movement.  Standing tolerance remains limited with hip/SI/lumbar pain without use of SPC.  Several seated rest breaks are required during tx. session to complete all aspects of ther.ex.  Supine L shoulder AROM: flexion (130 deg.)- pain, abd. (112 deg.)- slight regression since last visit, ER (60 deg.), IR (69 deg.).  Seated L shoulder flexion (109 deg.)- pain, abd. (98 deg.).  Grip strength: R (117#), L (64#)- regression since last reassessment.  Pt. will continue to benefit from skilled PT services  1x/ week over next 6 weeks to increase sh. ROM/ stability.      Clinical  Presentation  Evolving    Clinical Decision Making  Moderate    Rehab Potential  Fair    PT Frequency  1x / week    PT Duration  6 weeks    PT Treatment/Interventions  ADLs/Self Care Home Management;Moist Heat;Cryotherapy;Electrical Stimulation;Therapeutic activities;Functional mobility training;Therapeutic exercise;Neuromuscular re-education;Patient/family education;Manual techniques;Scar mobilization;Passive range of motion    PT Next Visit Plan  Progress shoulder strengthening/ functional ROM.  CHECK CAID AUTHORIZATION.    PT Home Exercise Plan  see notes.    Consulted and Agree with Plan of Care  Patient       Patient will benefit from skilled therapeutic intervention in order to improve the following deficits and impairments:  Decreased mobility, Hypomobility, Decreased scar mobility, Decreased range of motion, Decreased activity tolerance, Decreased strength, Pain, Postural dysfunction, Impaired UE functional use, Impaired flexibility  Visit Diagnosis: Chronic left shoulder pain  Muscle weakness (generalized)  Shoulder joint stiffness, left     Problem List Patient Active Problem List   Diagnosis Date Noted  . Chest pain 09/12/2014   Pura Spice, PT, DPT # [phone] 01/11/2018, 10:32 AM  [place] [facility] [address] [place], [place], [address] Phone: [phone]   Fax:  [phone]  Name: Dequarius Jeffries MRN: [id number] Date of Birth: 14-Oct-1960

## 2018-01-12 ENCOUNTER — Encounter: Payer: Medicaid Other | Admitting: Physical Therapy

## 2018-01-18 ENCOUNTER — Ambulatory Visit: Payer: Medicaid Other | Admitting: Physical Therapy

## 2018-01-18 DIAGNOSIS — G8929 Other chronic pain: Secondary | ICD-10-CM

## 2018-01-18 DIAGNOSIS — M6281 Muscle weakness (generalized): Secondary | ICD-10-CM

## 2018-01-18 DIAGNOSIS — M25512 Pain in left shoulder: Secondary | ICD-10-CM | POA: Diagnosis not present

## 2018-01-18 DIAGNOSIS — M25612 Stiffness of left shoulder, not elsewhere classified: Secondary | ICD-10-CM

## 2018-01-18 NOTE — Therapy (Signed)
[place] [facility] [address] [place], [place], [address] Phone: [phone]   Fax:  [phone]  Physical Therapy Treatment  Patient Details  Name: Hermen Mario MRN: [id number] Date of Birth: May 06, 1960 Referring Provider: Dr. Jeannie Fend   Encounter Date: 01/18/2018  PT End of Session - 01/18/18 0739    Visit Number  16    Number of Visits  21    Date for PT Re-Evaluation  02/22/18    PT Start Time  0719    PT Stop Time  0807    PT Time Calculation (min)  48 min    Activity Tolerance  Patient limited by pain    Behavior During Therapy  [facility] for tasks assessed/performed       Past Medical History:  Diagnosis Date  . Acid reflux   . Arthritis    "probably in my knees" (09/12/2014)  . Chronic lower back pain   . Complication of anesthesia    "anesthesia burndt my skin in ~ 04/2014 when I had colonoscopy"  . GERD (gastroesophageal reflux disease)   . Heart murmur    "when I was little"  . High cholesterol   . Lumbar herniated disc    "L3, 4, 5" (09/12/2014)  . Myocardial infarction ([place]) 2003  . Pneumonia    "once when I was real little"  . Refusal of blood transfusions as patient is Jehovah's Witness   . Shortness of breath    ' AT TIMES "  . Stroke [facility]) 2003   LEFT SIDE RESIDUAL (09/12/2014)  . Type II diabetes mellitus ([place])     Past Surgical History:  Procedure Laterality Date  . CARDIAC CATHETERIZATION  02/25/2013  . FOOT SURGERY Bilateral 1999   "took bones out of my toes so I could walk"  . LEFT HEART CATHETERIZATION WITH CORONARY ANGIOGRAM N/A 02/25/2013   Procedure: LEFT HEART CATHETERIZATION WITH CORONARY ANGIOGRAM;  Surgeon: Clent Demark, MD;  Location: [place] CATH [facility];  Service: Cardiovascular;  Laterality: N/A;    There were no vitals filed for this visit.  Subjective Assessment - 01/18/18 0725    Subjective  Pt. reports that he visited an MD over the weekened due to walking into a tree limb that  scratched his eye.  Pt. states that he has a small scratch in his cornea that the MD gave him a prescription eye drops that have helped but his eye sight is blurry.  Pt. also states that he is having abdominal pain this morning.      Pertinent History  SI joint fusion on L in 2017.  Pt. is filing for disability due to walking limitations.  Pt. enjoys fishing but unable to do right now due to L shoulder limitations.  Lives with 55 y/o father (had a stroke and dementia).        Limitations  Lifting;Writing;House hold activities    Patient Stated Goals  Increase L shoulder ROM/ strength.      Currently in Pain?  Yes    Pain Score  8     Pain Location  Shoulder    Pain Orientation  Left    Pain Descriptors / Indicators  Aching    Pain Type  Chronic pain    Pain Onset  More than a month ago          Treatment   There Ex:  Nautilus: 60# Lat Pull Down (15x2 with all ex.). 50# Tricep Ext. 50# B sh. Adduction with  handles 50# sh. Ext. With wand 50# Scapular Retraction 60# Chest Press  Standing BodyBlade in all planes of movement 30 sec x multiple bouts  PNF Patterns with 4# weights  Pt. Had difficulty with ER while performing movements  Bicep Curls 2x15 with 4# weights  Arm Bike 3 min forward/backward (not billed)     PT Long Term Goals - 01/11/18 0731      PT LONG TERM GOAL #1   Title  Pt. will increase FOTO from 30 to 60 to improve pain-free mobility.      Baseline  FOTO baseline on 5/28: 30.  Today (6/18): 47.  FOTO: 36 (regression over past several weeks today secondary to pain).  9/16: 44 today (goal of 60)    Time  6    Period  Weeks    Status  Partially Met    Target Date  02/22/18      PT LONG TERM GOAL #2   Title  Pt. will increase L shoulder AROM to WNL as compared to R shoulder to improve pain-free mobility.      Baseline  Supine L shoulder AROM: flexion (130 deg.)- pain, abd. (112 deg.)- slight regression since last visit, ER (60 deg.), IR (69 deg.).   Seated L shoulder flexion (109 deg.)- pain, abd. (98 deg.).  Grip strength: R (117#), L (64#)- regression since last reassessment.    Time  6    Period  Weeks    Status  Partially Met    Target Date  02/22/18      PT LONG TERM GOAL #3   Title  Pt. will progress L shoulder strength to grossly 4+/5 MMT to improve carrying/ lifting tasks.     Baseline  L shoulder strength grossly 4/5 MMT in available range.     Time  6    Period  Weeks    Status  Not Met    Target Date  02/22/18      PT LONG TERM GOAL #4   Title  Pt. able to return to fishing with no L shoulder/elbow pain or limitations.      Baseline  attempted and lasted 20 minutes (pain).      Time  6    Period  Weeks    Status  On-going    Target Date  02/22/18         Plan - 01/18/18 0808    Clinical Impression Statement  Pt. arm movements were much better today and was able to tolerate an increase in weight during exercises.  Pt. still has weakness with ER in L shoulder and reports pain in anterior portion of L shoulder with certain movements.  Pt. states that most of shoulder is feeling better with the exercises, with only pain coming from anterior portion of deltoid where incision was made.    Clinical Presentation  Evolving    Clinical Decision Making  Moderate    Rehab Potential  Fair    PT Frequency  1x / week    PT Duration  6 weeks    PT Treatment/Interventions  ADLs/Self Care Home Management;Moist Heat;Cryotherapy;Electrical Stimulation;Therapeutic activities;Functional mobility training;Therapeutic exercise;Neuromuscular re-education;Patient/family education;Manual techniques;Scar mobilization;Passive range of motion    PT Next Visit Plan  Increase reps of shoulder strengthening.    PT Home Exercise Plan  see notes.    Consulted and Agree with Plan of Care  Patient       Patient will benefit from skilled therapeutic intervention in order to improve the  following deficits and impairments:  Decreased mobility,  Hypomobility, Decreased scar mobility, Decreased range of motion, Decreased activity tolerance, Decreased strength, Pain, Postural dysfunction, Impaired UE functional use, Impaired flexibility  Visit Diagnosis: Chronic left shoulder pain  Muscle weakness (generalized)  Shoulder joint stiffness, left     Problem List Patient Active Problem List   Diagnosis Date Noted  . Chest pain 09/12/2014   Pura Spice, PT, DPT # [id number] Gwenlyn Saran, SPT 01/18/2018, 1:21 PM  [facility] [address] [place], [place], [address] Phone: [phone]   Fax:  [phone]  Name: Karol Liendo MRN: [id number] Date of Birth: 1960-09-13

## 2018-01-25 ENCOUNTER — Ambulatory Visit: Payer: Medicaid Other | Admitting: Physical Therapy

## 2018-01-25 ENCOUNTER — Encounter: Payer: Self-pay | Admitting: Physical Therapy

## 2018-01-25 DIAGNOSIS — M6281 Muscle weakness (generalized): Secondary | ICD-10-CM

## 2018-01-25 DIAGNOSIS — M25512 Pain in left shoulder: Principal | ICD-10-CM

## 2018-01-25 DIAGNOSIS — M25612 Stiffness of left shoulder, not elsewhere classified: Secondary | ICD-10-CM

## 2018-01-25 DIAGNOSIS — G8929 Other chronic pain: Secondary | ICD-10-CM

## 2018-01-25 NOTE — Therapy (Signed)
[facility] [address]. [place], [place], [address] Phone: [phone]   Fax:  [phone]  Physical Therapy Treatment  Patient Details  Name: Arch Methot MRN: [id number] Date of Birth: November 06, 1960 Referring Provider (PT): Dr. Jeannie Fend   Encounter Date: 01/25/2018  PT End of Session - 01/25/18 0832    Visit Number  17    Number of Visits  21    Date for PT Re-Evaluation  02/22/18    PT Start Time  0724    PT Stop Time  0827    PT Time Calculation (min)  63 min    Activity Tolerance  Patient limited by pain    Behavior During Therapy  [facility] for tasks assessed/performed       Past Medical History:  Diagnosis Date  . Acid reflux   . Arthritis    "probably in my knees" (09/12/2014)  . Chronic lower back pain   . Complication of anesthesia    "anesthesia burndt my skin in ~ 04/2014 when I had colonoscopy"  . GERD (gastroesophageal reflux disease)   . Heart murmur    "when I was little"  . High cholesterol   . Lumbar herniated disc    "L3, 4, 5" (09/12/2014)  . Myocardial infarction ([place]) 2003  . Pneumonia    "once when I was real little"  . Refusal of blood transfusions as patient is Jehovah's Witness   . Shortness of breath    ' AT TIMES "  . Stroke [facility]) 2003   LEFT SIDE RESIDUAL (09/12/2014)  . Type II diabetes mellitus ([place])     Past Surgical History:  Procedure Laterality Date  . CARDIAC CATHETERIZATION  02/25/2013  . FOOT SURGERY Bilateral 1999   "took bones out of my toes so I could walk"  . LEFT HEART CATHETERIZATION WITH CORONARY ANGIOGRAM N/A 02/25/2013   Procedure: LEFT HEART CATHETERIZATION WITH CORONARY ANGIOGRAM;  Surgeon: Clent Demark, MD;  Location: [place] CATH [facility];  Service: Cardiovascular;  Laterality: N/A;    There were no vitals filed for this visit.  Subjective Assessment - 01/25/18 0723    Subjective  Pt. reports that he was very inactive over the weekend and things have been "downhill"  lately.  Pt. reports that his eye is still giving him trouble and it is still blurry and hurts when he rubs it.  Pt. continues to report persistent L shoulder/ hip pain with daily tasks.      Pertinent History  SI joint fusion on L in 2017.  Pt. is filing for disability due to walking limitations.  Pt. enjoys fishing but unable to do right now due to L shoulder limitations.  Lives with 38 y/o father (had a stroke and dementia).        Limitations  Lifting;Writing;House hold activities    Patient Stated Goals  Increase L shoulder ROM/ strength.      Currently in Pain?  Yes    Pain Score  8     Pain Location  Shoulder    Pain Orientation  Left    Pain Descriptors / Indicators  Aching    Pain Type  Chronic pain    Pain Onset  More than a month ago    Pain Score  8    Pain Location  Back    Pain Orientation  Left;Lower    Pain Descriptors / Indicators  Aching    Pain Type  Chronic pain  Treatment  Scifit L5 10 min. B UE/LE (warm-up/ no charge).  Manual tx.:  Reassessed A/PROM in supine/ standing posture Supine L shoulder AP/PA/inf. Grade III mobs. 3x30 sec. Each STM to L anterior shoulder.     There Ex:  Nautilus: 60# Lat Pull Down(15x2 with all ex.). 50# Tricep Ext. 50# B sh. Adduction with handles 50# sh. Ext. With wand 50# Scapular Retraction 50# Chest Press  Reviewed home based ex. Program.        PT Long Term Goals - 01/11/18 0731      PT LONG TERM GOAL #1   Title  Pt. will increase FOTO from 30 to 60 to improve pain-free mobility.      Baseline  FOTO baseline on 5/28: 30.  Today (6/18): 47.  FOTO: 36 (regression over past several weeks today secondary to pain).  9/16: 44 today (goal of 60)    Time  6    Period  Weeks    Status  Partially Met    Target Date  02/22/18      PT LONG TERM GOAL #2   Title  Pt. will increase L shoulder AROM to WNL as compared to R shoulder to improve pain-free mobility.      Baseline  Supine L shoulder AROM:  flexion (130 deg.)- pain, abd. (112 deg.)- slight regression since last visit, ER (60 deg.), IR (69 deg.).  Seated L shoulder flexion (109 deg.)- pain, abd. (98 deg.).  Grip strength: R (117#), L (64#)- regression since last reassessment.    Time  6    Period  Weeks    Status  Partially Met    Target Date  02/22/18      PT LONG TERM GOAL #3   Title  Pt. will progress L shoulder strength to grossly 4+/5 MMT to improve carrying/ lifting tasks.     Baseline  L shoulder strength grossly 4/5 MMT in available range.     Time  6    Period  Weeks    Status  Not Met    Target Date  02/22/18      PT LONG TERM GOAL #4   Title  Pt. able to return to fishing with no L shoulder/elbow pain or limitations.      Baseline  attempted and lasted 20 minutes (pain).      Time  6    Period  Weeks    Status  On-going    Target Date  02/22/18          Plan - 01/25/18 0735    Clinical Impression Statement  Supine L shoulder A/PROM:  flexion (119/128 deg.), abduction (100/116 deg.), ER (22/32 deg.), IR (38/56 deg.).  Standing L shoulder A/PROM: flexion (90/121 deg.), abduction (71/ 134 deg.).  Pt. remains pain focused/ limited with reports of >8/10 L shoulder pain with overhead reaching/ manual tx.  Significant L shoulder capsule hypomobility during AP/PA mobs.  L anterior shoulder tenderness with palpation over incision at proximal biceps.  PT continues to focus on L shoulder strengthening in pain tolerable range with use of Nautilus machine.  Pt. will return to MD to discuss pain/ limitations in L shoulder ROM.  Pt. will continue to present HEP at this time.        Clinical Presentation  Evolving    Clinical Decision Making  Moderate    Rehab Potential  Fair    PT Frequency  1x / week    PT Duration  6 weeks  PT Treatment/Interventions  ADLs/Self Care Home Management;Moist Heat;Cryotherapy;Electrical Stimulation;Therapeutic activities;Functional mobility training;Therapeutic exercise;Neuromuscular  re-education;Patient/family education;Manual techniques;Scar mobilization;Passive range of motion    PT Next Visit Plan  Discuss MD f/u visit.  Progress L shoulder ROM/ stability.      PT Home Exercise Plan  see notes.    Consulted and Agree with Plan of Care  Patient       Patient will benefit from skilled therapeutic intervention in order to improve the following deficits and impairments:  Decreased mobility, Hypomobility, Decreased scar mobility, Decreased range of motion, Decreased activity tolerance, Decreased strength, Pain, Postural dysfunction, Impaired UE functional use, Impaired flexibility  Visit Diagnosis: Chronic left shoulder pain  Muscle weakness (generalized)  Shoulder joint stiffness, left     Problem List Patient Active Problem List   Diagnosis Date Noted  . Chest pain 09/12/2014   Pura Spice, PT, DPT # [phone] 01/25/2018, 8:45 AM  [facility] [address] [place], [place], [address] Phone: [phone]   Fax:  [phone]  Name: Bhavik Cabiness MRN: [id number] Date of Birth: [birth year]

## 2018-02-01 ENCOUNTER — Encounter: Payer: Medicaid Other | Admitting: Physical Therapy

## 2018-02-08 ENCOUNTER — Encounter: Payer: Medicaid Other | Admitting: Physical Therapy

## 2018-02-11 ENCOUNTER — Ambulatory Visit: Payer: Medicaid Other | Attending: Orthopedic Surgery | Admitting: Physical Therapy

## 2018-02-11 DIAGNOSIS — M6281 Muscle weakness (generalized): Secondary | ICD-10-CM

## 2018-02-11 DIAGNOSIS — M25512 Pain in left shoulder: Secondary | ICD-10-CM | POA: Insufficient documentation

## 2018-02-11 DIAGNOSIS — M25612 Stiffness of left shoulder, not elsewhere classified: Secondary | ICD-10-CM | POA: Insufficient documentation

## 2018-02-11 DIAGNOSIS — G8929 Other chronic pain: Secondary | ICD-10-CM | POA: Insufficient documentation

## 2018-02-11 NOTE — Patient Instructions (Signed)
Access Code: [email or web address]  URL: [email or web address]  Date: 02/11/2018  Prepared by: Tomasa Hose   Exercises  Seated Shoulder Flexion Towel Slide at Table Top - 10 reps - 3 sets - 1x daily - 7x weekly  Supine Shoulder Flexion with Dowel - 10 reps - 3 sets - 1x daily - 7x weekly  Supine Shoulder External Rotation with Dowel - 10 reps - 3 sets - 1x daily - 7x weekly

## 2018-02-15 ENCOUNTER — Encounter: Payer: Self-pay | Admitting: Physical Therapy

## 2018-02-15 ENCOUNTER — Encounter: Payer: Medicaid Other | Admitting: Physical Therapy

## 2018-02-15 NOTE — Therapy (Addendum)
[facility] [address]. [place], [place], [address] Phone: [phone]   Fax:  [phone]  Physical Therapy Evaluation  Patient Details  Name: Ethan Rosales MRN: [id number] Date of Birth: [birth year] Referring Provider (PT): Dr. Roney Mans   Encounter Date: 02/11/2018  PT End of Session - 02/15/18 0804    Visit Number  18    Number of Visits  27    Date for PT Re-Evaluation  04/01/18    PT Start Time  1016    PT Stop Time  1117    PT Time Calculation (min)  61 min    Activity Tolerance  Patient limited by pain    Behavior During Therapy  [facility] for tasks assessed/performed       Past Medical History:  Diagnosis Date  . Acid reflux   . Arthritis    "probably in my knees" (09/12/2014)  . Chronic lower back pain   . Complication of anesthesia    "anesthesia burndt my skin in ~ 04/2014 when I had colonoscopy"  . GERD (gastroesophageal reflux disease)   . Heart murmur    "when I was little"  . High cholesterol   . Lumbar herniated disc    "L3, 4, 5" (09/12/2014)  . Myocardial infarction ([facility]) 2003  . Pneumonia    "once when I was real little"  . Refusal of blood transfusions as patient is Jehovah's Witness   . Shortness of breath    ' AT TIMES "  . Stroke [facility]) 2003   LEFT SIDE RESIDUAL (09/12/2014)  . Type II diabetes mellitus ([facility])     Past Surgical History:  Procedure Laterality Date  . CARDIAC CATHETERIZATION  02/25/2013  . FOOT SURGERY Bilateral 1999   "took bones out of my toes so I could walk"  . LEFT HEART CATHETERIZATION WITH CORONARY ANGIOGRAM N/A 02/25/2013   Procedure: LEFT HEART CATHETERIZATION WITH CORONARY ANGIOGRAM;  Surgeon: Robynn Pane, MD;  Location: [facility];  Service: Cardiovascular;  Laterality: N/A;    There were no vitals filed for this visit.       [facility] PT Assessment - 02/15/18 0001      Assessment   Medical Diagnosis  S/p L shoulder biceps tenodesis/ arthrocopy, L shoulder  manipulation under anesthesia, L shoulder pain    Referring Provider (PT)  Dr. Roney Mans    Onset Date/Surgical Date  02/09/18    Prior Therapy  Yes      Prior Function   Level of Independence  Independent         SUBJECTIVE   Chief complaint:  L Shoulder Pain Onset: Several months ago, s/p first surgery on L shoulder Referring Dx: Manipulation of L Shoulder under anesthesia, lysis of adhesions,  MD: Patsy Lager, MD Pain: 9/10 Present, 9/10 Best, 9/10 Worst: Prior history of back injury or pain: Yes Pain quality: pain quality: aching Waking pain: Yes Radiating pain: No  Numbness/Tingling: No Follow-up appointment with MD: Yes  OBJECTIVE  Posture Pt. Walks into clinic with sling on, however sling has improper placement that was fixed by therapist.  Pt. Encouraged to only wear sling for comfort in crowded places, however he needs to move arm throughout the weekend to keep ROM.    Gait Continues to walk with SPC in R UE and L UE in sling.   Palpation Pt. Tender to palpation in L shoulder specifically anterior capsule.  Pt. Noted to have bandages placed over incision sites.  Pt. Was  encouraged to reach out to MD for more information on removal of bandages.   PROM (degrees)  L shoulder PROM: Flexion (152 deg), abduction (136 deg), ER (59 deg), IR (91 deg).    Objective measurements completed on examination: See above findings.   TREATMENT  Therapeutic Exercise: UBE x5 min (hx taken during) Seated L shoulder flexion towel slides 8x 15 sec hold, ~30 sec rest between bouts  Patient educated on technique, importance of maintaining ROM and working into end-range.    Manual Therapy: Supine PROM to end range for Flexion, Abduction, ER/IR x 20 min; Patient required verbal cues for breathing strategies to manage pain and maximal encouragement to endure stretches at end-range.    PT Long Term Goals - 02/15/18 1206      PT LONG TERM GOAL #1   Title  Pt. will increase  FOTO from 20 to 60 to improve pain-free mobility.      Baseline  FOTO baseline on 10/17: 20    Time  8    Period  Weeks    Status  New    Target Date  04/01/18      PT LONG TERM GOAL #2   Title  Pt. will increase L shoulder AROM to WNL as compared to R shoulder to improve pain-free mobility.      Baseline  L shoulder PROM: Flexion (152 deg), abduction (136 deg), ER (59 deg), IR (91 deg).     Time  8    Period  Weeks    Status  New    Target Date  04/01/18      PT LONG TERM GOAL #3   Title  Pt. will progress L shoulder strength to grossly 4+/5 MMT to improve carrying/ lifting tasks.     Baseline  Unable to assess at this time per MD protocol.    Time  8    Period  Weeks    Status  New    Target Date  04/01/18             Plan - 02/15/18 1139    Clinical Impression Statement  Pt. is pleasant 57 y.o. male seeking therapy s/p shoulder manipulation under anesthesia.  MD has protocol that was sent with patient for PT to abide by.  Pt. underwent agressive manual therapy per MD protocol with increased pain levels with movement.  The following measurements were obtained in supine:  L shoulder PROM: Flexion (152 deg), abduction (136 deg), ER (59 deg), IR (91 deg).  Pt. demonstrates weakness of L shoulder, with decreased mobility, ROM and increase pain.  Pt. will benefit from skilled therapy to address these deficits in order to return to prior level of function at home.    Clinical Presentation  Evolving    Clinical Presentation due to:  chronic pain, osteoarthritis, PMH stroke w/ left side involvement and MI, co-morbidities including: type II DM and hyperlipidemia    Clinical Decision Making  Moderate    Rehab Potential  Fair    PT Frequency  1x / week    PT Duration  8 weeks    PT Treatment/Interventions  ADLs/Self Care Home Management;Moist Heat;Cryotherapy;Electrical Stimulation;Therapeutic activities;Functional mobility training;Therapeutic exercise;Neuromuscular  re-education;Patient/family education;Manual techniques;Scar mobilization;Passive range of motion    PT Next Visit Plan  Follow MD protocol.    PT Home Exercise Plan  see notes.    Consulted and Agree with Plan of Care  Patient       Patient will benefit from skilled therapeutic  intervention in order to improve the following deficits and impairments:  Decreased mobility, Hypomobility, Decreased scar mobility, Decreased range of motion, Decreased activity tolerance, Decreased strength, Pain, Postural dysfunction, Impaired UE functional use, Impaired flexibility  Visit Diagnosis: Chronic left shoulder pain  Muscle weakness (generalized)  Shoulder joint stiffness, left     Problem List Patient Active Problem List   Diagnosis Date Noted  . Chest pain 09/12/2014    [name], SPT  This entire session was performed under direct supervision and direction of a licensed therapist/therapist assistant . I have personally read, edited and approve of the note as written.  Sheria Lang PT, DPT [phone] 02/15/2018, 1:11 PM  [facility] [address] [place], [place], [address] Phone: [phone]   Fax:  [phone]  Name: Kholton Coate MRN: [id number] Date of Birth: 12/23/1960

## 2018-02-18 ENCOUNTER — Ambulatory Visit: Payer: Medicaid Other | Admitting: Physical Therapy

## 2018-02-18 ENCOUNTER — Encounter: Payer: Self-pay | Admitting: Physical Therapy

## 2018-02-18 VITALS — BP 127/86

## 2018-02-18 DIAGNOSIS — M25512 Pain in left shoulder: Principal | ICD-10-CM

## 2018-02-18 DIAGNOSIS — G8929 Other chronic pain: Secondary | ICD-10-CM

## 2018-02-18 DIAGNOSIS — M6281 Muscle weakness (generalized): Secondary | ICD-10-CM

## 2018-02-18 DIAGNOSIS — M25612 Stiffness of left shoulder, not elsewhere classified: Secondary | ICD-10-CM

## 2018-02-18 NOTE — Therapy (Signed)
[facility] [address] [place], [place], [address] Phone: [phone]   Fax:  [phone]  Physical Therapy Treatment  Patient Details  Name: Ethan Rosales MRN: [id number] Date of Birth: [birth year] Referring Provider (PT): Dr. Roney Mans   Encounter Date: 02/18/2018  PT End of Session - 02/19/18 0739    Visit Number  19    Number of Visits  27    Date for PT Re-Evaluation  04/01/18    PT Start Time  0722    PT Stop Time  0820    PT Time Calculation (min)  58 min    Activity Tolerance  Patient limited by pain    Behavior During Therapy  [facility] for tasks assessed/performed       Past Medical History:  Diagnosis Date  . Acid reflux   . Arthritis    "probably in my knees" (09/12/2014)  . Chronic lower back pain   . Complication of anesthesia    "anesthesia burndt my skin in ~ 04/2014 when I had colonoscopy"  . GERD (gastroesophageal reflux disease)   . Heart murmur    "when I was little"  . High cholesterol   . Lumbar herniated disc    "L3, 4, 5" (09/12/2014)  . Myocardial infarction ([facility]) 2003  . Pneumonia    "once when I was real little"  . Refusal of blood transfusions as patient is Jehovah's Witness   . Shortness of breath    ' AT TIMES "  . Stroke [facility]) 2003   LEFT SIDE RESIDUAL (09/12/2014)  . Type II diabetes mellitus ([facility])     Past Surgical History:  Procedure Laterality Date  . CARDIAC CATHETERIZATION  02/25/2013  . FOOT SURGERY Bilateral 1999   "took bones out of my toes so I could walk"  . LEFT HEART CATHETERIZATION WITH CORONARY ANGIOGRAM N/A 02/25/2013   Procedure: LEFT HEART CATHETERIZATION WITH CORONARY ANGIOGRAM;  Surgeon: Robynn Pane, MD;  Location: [facility] CATH [facility];  Service: Cardiovascular;  Laterality: N/A;    Vitals:   02/18/18 0729  BP: 127/86    Subjective Assessment - 02/18/18 0729    Subjective  Pt. notes he has not been wearing sling very much over the last week.  Pt. notes he has been  more active with daily tasks.  Good compliance with HEP.  Pt. reports persistent pain in shoulder/ back.      Pertinent History  SI joint fusion on L in 2017.  Pt. is filing for disability due to walking limitations.  Pt. enjoys fishing but unable to do right now due to L shoulder limitations.  Lives with 77 y/o father (had a stroke and dementia).        Limitations  Lifting;Writing;House hold activities    Patient Stated Goals  Increase L shoulder ROM/ strength.      Currently in Pain?  Yes    Pain Score  6     Pain Location  Shoulder    Pain Orientation  Left    Pain Descriptors / Indicators  Aching    Pain Type  Chronic pain;Surgical pain    Pain Onset  Yesterday    Pain Frequency  Constant            Treatment   There Ex:  Nautilus: 60# Tricep Ext. 2x10 60# Shoulder Flexion for Stretch 30 sec holds x multiple bouts 40# Shoulder Adduction2x10  Supine IR/ER Shoulder Isometrics 2x10 with 10 sec holds    Manual  Therapy:  Supine L shoulder AA/PROM to end range for Flexion, Abduction, ER/IR x 34 min;   Patient required verbal cues for breathing strategies to manage pain and maximal encouragement to endure stretches at end-range. STM to L deltoid/ pec/ prox. Biceps region.  Discussed incision healing/ steristrips.         PT Long Term Goals - 02/15/18 1206      PT LONG TERM GOAL #1   Title  Pt. will increase FOTO from 20 to 60 to improve pain-free mobility.      Baseline  FOTO baseline on 10/17: 20    Time  8    Period  Weeks    Status  New    Target Date  04/01/18      PT LONG TERM GOAL #2   Title  Pt. will increase L shoulder AROM to WNL as compared to R shoulder to improve pain-free mobility.      Baseline  L shoulder PROM: Flexion (152 deg), abduction (136 deg), ER (59 deg), IR (91 deg).     Time  8    Period  Weeks    Status  New    Target Date  04/01/18      PT LONG TERM GOAL #3   Title  Pt. will progress L shoulder strength to grossly 4+/5 MMT  to improve carrying/ lifting tasks.     Baseline  Unable to assess at this time per MD protocol.    Time  8    Period  Weeks    Status  New    Target Date  04/01/18            Plan - 02/18/18 0806    Clinical Impression Statement  Pt. is making improvements with movement, however is still guarded/ protective over movement at end range.  ROM measurements were taken actively in supine position and noted to be:  L Shoulder Flexion (134 deg), ER (48 deg).  Pt. steri-strips were removed and new steri-strips were replaced.  No seperation of stitches were noted, and appears to be healing well.  Pt. given steri-strips to be taken home if one comes loose before tomorrow, so he will be able to replace.  Pt. will be seeing Dr. Roney Mans tomorrow to be re-evaluated for shoulder mobility.  Pt. encouraged to continue with movement-based exercises/ stretches to keep mobility in shoulder to prevent scar tissue from forming.      Clinical Presentation  Evolving    Clinical Decision Making  Moderate    Rehab Potential  Fair    PT Frequency  1x / week    PT Duration  8 weeks    PT Treatment/Interventions  ADLs/Self Care Home Management;Moist Heat;Cryotherapy;Electrical Stimulation;Therapeutic activities;Functional mobility training;Therapeutic exercise;Neuromuscular re-education;Patient/family education;Manual techniques;Scar mobilization;Passive range of motion    PT Next Visit Plan  Follow MD protocol.  Discuss MD f/u visit.      PT Home Exercise Plan  see notes.    Consulted and Agree with Plan of Care  Patient       Patient will benefit from skilled therapeutic intervention in order to improve the following deficits and impairments:  Decreased mobility, Hypomobility, Decreased scar mobility, Decreased range of motion, Decreased activity tolerance, Decreased strength, Pain, Postural dysfunction, Impaired UE functional use, Impaired flexibility  Visit Diagnosis: Chronic left shoulder pain  Muscle  weakness (generalized)  Shoulder joint stiffness, left     Problem List Patient Active Problem List   Diagnosis Date Noted  . Chest  pain 09/12/2014   Cammie Mcgee, PT, DPT # [id number] [name], SPT 02/19/2018, 7:41 AM  [facility] [address] [place], [place], [address] Phone: [phone]   Fax:  [phone]  Name: Mostafa Yuan MRN: [id number] Date of Birth: 13-Nov-1960

## 2018-02-22 ENCOUNTER — Ambulatory Visit: Payer: Medicaid Other | Admitting: Physical Therapy

## 2018-02-22 ENCOUNTER — Encounter: Payer: Medicaid Other | Admitting: Physical Therapy

## 2018-02-22 ENCOUNTER — Encounter: Payer: Self-pay | Admitting: Physical Therapy

## 2018-02-22 DIAGNOSIS — M25512 Pain in left shoulder: Secondary | ICD-10-CM | POA: Diagnosis not present

## 2018-02-22 DIAGNOSIS — M6281 Muscle weakness (generalized): Secondary | ICD-10-CM

## 2018-02-22 DIAGNOSIS — M25612 Stiffness of left shoulder, not elsewhere classified: Secondary | ICD-10-CM

## 2018-02-22 DIAGNOSIS — G8929 Other chronic pain: Secondary | ICD-10-CM

## 2018-02-22 NOTE — Therapy (Signed)
[facility] [address] [place], [place], [address] Phone: [phone]   Fax:  [phone]  Physical Therapy Treatment  Patient Details  Name: Ethan Rosales MRN: [id number] Date of Birth: 1961-03-17 Referring Provider (PT): Dr. Roney Mans   Encounter Date: 02/22/2018  PT End of Session - 02/22/18 0808    Visit Number  20    Number of Visits  27    Date for PT Re-Evaluation  04/01/18    PT Start Time  0758    PT Stop Time  0852    PT Time Calculation (min)  54 min    Activity Tolerance  Patient limited by pain    Behavior During Therapy  [facility] for tasks assessed/performed       Past Medical History:  Diagnosis Date  . Acid reflux   . Arthritis    "probably in my knees" (09/12/2014)  . Chronic lower back pain   . Complication of anesthesia    "anesthesia burndt my skin in ~ 04/2014 when I had colonoscopy"  . GERD (gastroesophageal reflux disease)   . Heart murmur    "when I was little"  . High cholesterol   . Lumbar herniated disc    "L3, 4, 5" (09/12/2014)  . Myocardial infarction (HCC) 2003  . Pneumonia    "once when I was real little"  . Refusal of blood transfusions as patient is Jehovah's Witness   . Shortness of breath    ' AT TIMES "  . Stroke [facility]) 2003   LEFT SIDE RESIDUAL (09/12/2014)  . Type II diabetes mellitus (HCC)     Past Surgical History:  Procedure Laterality Date  . CARDIAC CATHETERIZATION  02/25/2013  . FOOT SURGERY Bilateral 1999   "took bones out of my toes so I could walk"  . LEFT HEART CATHETERIZATION WITH CORONARY ANGIOGRAM N/A 02/25/2013   Procedure: LEFT HEART CATHETERIZATION WITH CORONARY ANGIOGRAM;  Surgeon: Robynn Pane, MD;  Location: [facility] C[facility];  Service: Cardiovascular;  Laterality: N/A;    There were no vitals filed for this visit.  Subjective Assessment - 02/22/18 0803    Subjective  Pt. reports he has been experiencing cramping of the L hand, specifically in the third and  fourth digits.  Pt. notes he performed HEP a little over the weekend.  He notes he tried to cook a Sunday dinner however it hurt due to not being able to stand for a long time.  Pt. reports visit with MD went well and they encouraged him to stay active with exercises.  Stitches were removed at last MD visit.    Pertinent History  SI joint fusion on L in 2017.  Pt. is filing for disability due to walking limitations.  Pt. enjoys fishing but unable to do right now due to L shoulder limitations.  Lives with 55 y/o father (had a stroke and dementia).        Limitations  Lifting;Writing;House hold activities    Patient Stated Goals  Increase L shoulder ROM/ strength.      Currently in Pain?  Yes    Pain Score  5     Pain Location  Shoulder    Pain Orientation  Left    Pain Descriptors / Indicators  Aching    Pain Type  Chronic pain;Surgical pain    Pain Onset  Yesterday       Treatment   Ther Ex:  Nautilus: 60# Tricep Ext. 2x15 60# Shoulder Flexion for  Stretch 30 sec holds x multiple bouts 40# Shoulder Adduction2x15  Supine IR/ER Shoulder Isometrics 2x10 with 10 sec holds Supine B Shoulder Flexion/ER with Weighted bar 2x15 Standing Ladder Climbs with L UE Abd/Flex x5 each  Manual Therapy:  Supine L shoulder AA/PROM to end range for Flexion, Abduction, ER/IR x 24 min;            Pt. much better with breathing patterns today and able to achieve greater ROM actively today.    PT Long Term Goals - 02/15/18 1206      PT LONG TERM GOAL #1   Title  Pt. will increase FOTO from 20 to 60 to improve pain-free mobility.      Baseline  FOTO baseline on 10/17: 20    Time  8    Period  Weeks    Status  New    Target Date  04/01/18      PT LONG TERM GOAL #2   Title  Pt. will increase L shoulder AROM to WNL as compared to R shoulder to improve pain-free mobility.      Baseline  L shoulder PROM: Flexion (152 deg), abduction (136 deg), ER (59 deg), IR (91 deg).     Time  8    Period   Weeks    Status  New    Target Date  04/01/18      PT LONG TERM GOAL #3   Title  Pt. will progress L shoulder strength to grossly 4+/5 MMT to improve carrying/ lifting tasks.     Baseline  Unable to assess at this time per MD protocol.    Time  8    Period  Weeks    Status  New    Target Date  04/01/18            Plan - 02/22/18 1133    Clinical Impression Statement  Pt. making steady improvements with ROM and pain tolerance.  Pt. much less guarded with movements at end range and displays much better breathing patterns with ROM.  Per patient report, MD is happy with results thus far, however pt. needs to be more active at home with HEP and using arm more frequently in order to maintain therapeutic gains and prevent limitations in ROM and strength.    Clinical Presentation  Evolving    Clinical Decision Making  Moderate    Rehab Potential  Fair    PT Frequency  1x / week    PT Duration  8 weeks    PT Treatment/Interventions  ADLs/Self Care Home Management;Moist Heat;Cryotherapy;Electrical Stimulation;Therapeutic activities;Functional mobility training;Therapeutic exercise;Neuromuscular re-education;Patient/family education;Manual techniques;Scar mobilization;Passive range of motion    PT Next Visit Plan  Follow MD protocol.  Discuss MD f/u visit.      PT Home Exercise Plan  see notes.    Consulted and Agree with Plan of Care  Patient       Patient will benefit from skilled therapeutic intervention in order to improve the following deficits and impairments:  Decreased mobility, Hypomobility, Decreased scar mobility, Decreased range of motion, Decreased activity tolerance, Decreased strength, Pain, Postural dysfunction, Impaired UE functional use, Impaired flexibility  Visit Diagnosis: Chronic left shoulder pain  Muscle weakness (generalized)  Shoulder joint stiffness, left     Problem List Patient Active Problem List   Diagnosis Date Noted  . Chest pain 09/12/2014     [name], SPT  This entire session was performed under direct supervision and direction of a licensed therapist/therapist assistant .  I have personally read, edited and approve of the note as written.  Sheria Lang PT, DPT [id number] 02/22/2018, 1:23 PM  [place] [facility] [address] [place], [place], [address] Phone: [phone]   Fax:  [phone]  Name: Kees Idrovo MRN: [id number] Date of Birth: October 03, 1960

## 2018-03-01 ENCOUNTER — Encounter: Payer: Self-pay | Admitting: Physical Therapy

## 2018-03-01 ENCOUNTER — Ambulatory Visit: Payer: Medicaid Other | Attending: Orthopedic Surgery | Admitting: Physical Therapy

## 2018-03-01 DIAGNOSIS — M25612 Stiffness of left shoulder, not elsewhere classified: Secondary | ICD-10-CM | POA: Insufficient documentation

## 2018-03-01 DIAGNOSIS — G8929 Other chronic pain: Secondary | ICD-10-CM | POA: Diagnosis present

## 2018-03-01 DIAGNOSIS — M25512 Pain in left shoulder: Secondary | ICD-10-CM | POA: Diagnosis present

## 2018-03-01 DIAGNOSIS — M6281 Muscle weakness (generalized): Secondary | ICD-10-CM | POA: Diagnosis present

## 2018-03-01 NOTE — Therapy (Deleted)
[facility] [address] [place], [place], [address] Phone: [phone]   Fax:  [phone]  Physical Therapy Evaluation  Patient Details  Name: Ethan Rosales MRN: [id number] Date of Birth: 20-Jun-1960 Referring Provider (PT): Dr. Roney Rosales   Encounter Date: 03/01/2018  PT End of Session - 03/01/18 0752    Visit Number  21    Number of Visits  27    Date for PT Re-Evaluation  04/01/18    PT Start Time  0710    PT Stop Time  0801    PT Time Calculation (min)  51 min    Activity Tolerance  Patient limited by pain    Behavior During Therapy  [facility] for tasks assessed/performed       Past Medical History:  Diagnosis Date  . Acid reflux   . Arthritis    "probably in my knees" (09/12/2014)  . Chronic lower back pain   . Complication of anesthesia    "anesthesia burndt my skin in ~ 04/2014 when I had colonoscopy"  . GERD (gastroesophageal reflux disease)   . Heart murmur    "when I was little"  . High cholesterol   . Lumbar herniated disc    "L3, 4, 5" (09/12/2014)  . Myocardial infarction (HCC) 2003  . Pneumonia    "once when I was real little"  . Refusal of blood transfusions as patient is Jehovah's Witness   . Shortness of breath    ' AT TIMES "  . Stroke Ethan [facility]) 2003   LEFT SIDE RESIDUAL (09/12/2014)  . Type II diabetes mellitus (HCC)     Past Surgical History:  Procedure Laterality Date  . CARDIAC CATHETERIZATION  02/25/2013  . FOOT SURGERY Bilateral 1999   "took bones out of my toes so I could walk"  . LEFT HEART CATHETERIZATION WITH CORONARY ANGIOGRAM N/A 02/25/2013   Procedure: LEFT HEART CATHETERIZATION WITH CORONARY ANGIOGRAM;  Surgeon: Ethan Pane, MD;  [facility]: [facility]ATH [facility];  Service: Cardiovascular;  Laterality: N/A;    There were no vitals filed for this visit.   Subjective Assessment - 03/01/18 0716    Subjective  Pt. reports he will be going to the pain management clinic after treatment today.  Pt.  notes increased pain with movement, however is able to achieve greater ROM than in prior visits.    Pertinent History  SI joint fusion on L in 2017.  Pt. is filing for disability due to walking limitations.  Pt. enjoys fishing but unable to do right now due to L shoulder limitations.  Lives with 41 y/o father (had a stroke and dementia).        Limitations  Lifting;Writing;House hold activities    Patient Stated Goals  Increase L shoulder ROM/ strength.      Pain Score  7     Pain Location  Shoulder    Pain Orientation  Left    Pain Descriptors / Indicators  Aching    Pain Onset  Yesterday                    Objective measurements completed on examination: See above findings.                   PT Long Term Goals - 02/15/18 1206      PT LONG TERM GOAL #1   Title  Pt. will increase FOTO from 20 to 60 to improve pain-free mobility.  Baseline  FOTO baseline on 10/17: 20    Time  8    Period  Weeks    Status  New    Target Date  04/01/18      PT LONG TERM GOAL #2   Title  Pt. will increase L shoulder AROM to WNL as compared to R shoulder to improve pain-free mobility.      Baseline  L shoulder PROM: Flexion (152 deg), abduction (136 deg), ER (59 deg), IR (91 deg).     Time  8    Period  Weeks    Status  New    Target Date  04/01/18      PT LONG TERM GOAL #3   Title  Pt. will progress L shoulder strength to grossly 4+/5 MMT to improve carrying/ lifting tasks.     Baseline  Unable to assess at this time per MD protocol.    Time  8    Period  Weeks    Status  New    Target Date  04/01/18               Patient will benefit from skilled therapeutic intervention in order to improve the following deficits and impairments:     Visit Diagnosis: Chronic left shoulder pain  Muscle weakness (generalized)  Shoulder joint stiffness, left     Problem List Patient Active Problem List   Diagnosis Date Noted  . Chest pain 09/12/2014     Ethan Rosales 03/01/2018, 8:13 AM  [facility] [address] [place], [place], [address] Phone: [phone]   Fax:  [phone]  Name: Ethan Rosales MRN: [id number] Date of Birth: 04-Mar-1961

## 2018-03-01 NOTE — Therapy (Signed)
[facility] [address] [place], [place], [address] Phone: [phone]   Fax:  [phone]  Physical Therapy Treatment  Patient Details  Name: Ethan [facility]ales MRN: [id number] Date of Birth: Oct 18, 1960 Referring Provider (PT): Dr. Roney Mans   Encounter Date: 03/01/2018  PT End of Session - 03/01/18 0752    Visit Number  21    Number of Visits  27    Date for PT Re-Evaluation  04/01/18    PT Start Time  0710    PT Stop Time  0801    PT Time Calculation (min)  51 min    Activity Tolerance  Patient limited by pain    Behavior During Therapy  [facility] for tasks assessed/performed       Past Medical History:  Diagnosis Date  . Acid reflux   . Arthritis    "probably in my knees" (09/12/2014)  . Chronic lower back pain   . Complication of anesthesia    "anesthesia burndt my skin in ~ 04/2014 when I had colonoscopy"  . GERD (gastroesophageal reflux disease)   . Heart murmur    "when I was little"  . High cholesterol   . Lumbar herniated disc    "L3, 4, 5" (09/12/2014)  . Myocardial infarction (HCC) 2003  . Pneumonia    "once when I was real little"  . Refusal of blood transfusions as patient is Jehovah's Witness   . Shortness of breath    ' AT TIMES "  . Stroke [facility]) 2003   LEFT SIDE RESIDUAL (09/12/2014)  . Type II diabetes mellitus (HCC)     Past Surgical History:  Procedure Laterality Date  . CARDIAC CATHETERIZATION  02/25/2013  . FOOT SURGERY Bilateral 1999   "took bones out of my toes so I could walk"  . LEFT HEART CATHETERIZATION WITH CORONARY ANGIOGRAM N/A 02/25/2013   Procedure: LEFT HEART CATHETERIZATION WITH CORONARY ANGIOGRAM;  Surgeon: Robynn Pane, MD;  [facility]: [facility];  Service: Cardiovascular;  Laterality: N/A;    There were no vitals filed for this visit.  Subjective Assessment - 03/01/18 0716    Subjective  Pt. reports he will be going to the pain management clinic after treatment today.  Pt.  notes increased pain with movement, however is able to achieve greater ROM than in prior visits.    Pertinent History  SI joint fusion on L in 2017.  Pt. is filing for disability due to walking limitations.  Pt. enjoys fishing but unable to do right now due to L shoulder limitations.  Lives with 70 y/o father (had a stroke and dementia).        Limitations  Lifting;Writing;House hold activities    Patient Stated Goals  Increase L shoulder ROM/ strength.      Pain Score  7     Pain Location  Shoulder    Pain Orientation  Left    Pain Descriptors / Indicators  Aching    Pain Onset  Yesterday           Treatment   Ther Ex:  Nautilus: 60# Tricep Ext.2x15 60# Shoulder Flexion for Stretch 30 sec holds x multiple bouts 70# Shoulder Adductionx15  Supine IR/ER Shoulder Isometrics 2x10 with 10 sec holds Supine L shoulder flexion/abduction Isometrics at end range, 2x10 with 10 sec holds  Manual Therapy:  SupineL shoulder AA/PROM to end range for Flexion, Abduction, ER/IR x77min;  Supine L shoulder AP/inf./ lateral mobs 5x30 sec. (significant hypomobility).  STM to L anterior deltoid/ pec. Musculature (as tolerated).          PT Long Term Goals - 02/15/18 1206      PT LONG TERM GOAL #1   Title  Pt. will increase FOTO from 20 to 60 to improve pain-free mobility.      Baseline  FOTO baseline on 10/17: 20    Time  8    Period  Weeks    Status  New    Target Date  04/01/18      PT LONG TERM GOAL #2   Title  Pt. will increase L shoulder AROM to WNL as compared to R shoulder to improve pain-free mobility.      Baseline  L shoulder PROM: Flexion (152 deg), abduction (136 deg), ER (59 deg), IR (91 deg).     Time  8    Period  Weeks    Status  New    Target Date  04/01/18      PT LONG TERM GOAL #3   Title  Pt. will progress L shoulder strength to grossly 4+/5 MMT to improve carrying/ lifting tasks.     Baseline  Unable to assess at this time per MD protocol.     Time  8    Period  Weeks    Status  New    Target Date  04/01/18         Plan - 03/01/18 1242    Clinical Impression Statement  Pt. continues to make progress towards goals and is able to achieve greater ROM actively in supine position.  Pt. did report increased pain with joint mobilizations to shoulder.  Pt. is hypomobile with inferior and posterior joint mobilizations in supine position.  Pt. will continue to benefit from mobilization and strengthening exercises going forward in therapy sessions.    Clinical Presentation  Evolving    Clinical Decision Making  Moderate    Rehab Potential  Fair    PT Frequency  1x / week    PT Duration  8 weeks    PT Treatment/Interventions  ADLs/Self Care Home Management;Moist Heat;Cryotherapy;Electrical Stimulation;Therapeutic activities;Functional mobility training;Therapeutic exercise;Neuromuscular re-education;Patient/family education;Manual techniques;Scar mobilization;Passive range of motion    PT Next Visit Plan  Continued joint mobilzations.    PT Home Exercise Plan  see notes.    Consulted and Agree with Plan of Care  Patient       Patient will benefit from skilled therapeutic intervention in order to improve the following deficits and impairments:  Decreased mobility, Hypomobility, Decreased scar mobility, Decreased range of motion, Decreased activity tolerance, Decreased strength, Pain, Postural dysfunction, Impaired UE functional use, Impaired flexibility  Visit Diagnosis: Chronic left shoulder pain  Muscle weakness (generalized)  Shoulder joint stiffness, left     Problem List Patient Active Problem List   Diagnosis Date Noted  . Chest pain 09/12/2014   Cammie Mcgee, PT, DPT # [id number] [name], SPT 03/01/2018, 5:54 PM  [place] [facility] [address] [place], [place], [address] Phone: [phone]   Fax:  [phone]  Name: Ethan [facility]ales MRN: [id number] Date of Birth:  1960-10-26

## 2018-03-08 ENCOUNTER — Encounter: Payer: Self-pay | Admitting: Physical Therapy

## 2018-03-08 ENCOUNTER — Ambulatory Visit: Payer: Medicaid Other | Admitting: Physical Therapy

## 2018-03-08 DIAGNOSIS — M6281 Muscle weakness (generalized): Secondary | ICD-10-CM

## 2018-03-08 DIAGNOSIS — M25612 Stiffness of left shoulder, not elsewhere classified: Secondary | ICD-10-CM

## 2018-03-08 DIAGNOSIS — M25512 Pain in left shoulder: Principal | ICD-10-CM

## 2018-03-08 DIAGNOSIS — G8929 Other chronic pain: Secondary | ICD-10-CM

## 2018-03-08 NOTE — Therapy (Signed)
[place] [facility] [address] [place], [place], [address] Phone: [phone]   Fax:  [phone]  Physical Therapy Treatment  Patient Details  Name: Ethan [facility] MRN: [id number] Date of Birth: 09-21-1960 Referring Provider (PT): Dr. Roney Mans   Encounter Date: 03/08/2018  PT End of Session - 03/08/18 0711    Visit Number  22    Number of Visits  27    Date for PT Re-Evaluation  04/01/18    PT Start Time  0707    PT Stop Time  0759    PT Time Calculation (min)  52 min    Activity Tolerance  Patient limited by pain    Behavior During Therapy  [facility] for tasks assessed/performed       Past Medical History:  Diagnosis Date  . Acid reflux   . Arthritis    "probably in my knees" (09/12/2014)  . Chronic lower back pain   . Complication of anesthesia    "anesthesia burndt my skin in ~ 04/2014 when I had colonoscopy"  . GERD (gastroesophageal reflux disease)   . Heart murmur    "when I was little"  . High cholesterol   . Lumbar herniated disc    "L3, 4, 5" (09/12/2014)  . Myocardial infarction (HCC) 2003  . Pneumonia    "once when I was real little"  . Refusal of blood transfusions as patient is Jehovah's Witness   . Shortness of breath    ' AT TIMES "  . Stroke [facility]) 2003   LEFT SIDE RESIDUAL (09/12/2014)  . Type II diabetes mellitus (HCC)     Past Surgical History:  Procedure Laterality Date  . CARDIAC CATHETERIZATION  02/25/2013  . FOOT SURGERY Bilateral 1999   "took bones out of my toes so I could walk"  . LEFT HEART CATHETERIZATION WITH CORONARY ANGIOGRAM N/A 02/25/2013   Procedure: LEFT HEART CATHETERIZATION WITH CORONARY ANGIOGRAM;  Surgeon: Robynn Pane, MD;  [facility]: [facility]ATH [facility];  Service: Cardiovascular;  Laterality: N/A;    There were no vitals filed for this visit.  Subjective Assessment - 03/08/18 0705    Subjective  Pt. reports 4/10 L shoulder pain currently and 8/10 L SI/back pain.  Pt. states he started  insulin last week and has been sick.       Pertinent History  SI joint fusion on L in 2017.  Pt. is filing for disability due to walking limitations.  Pt. enjoys fishing but unable to do right now due to L shoulder limitations.  Lives with 7 y/o father (had a stroke and dementia).        Limitations  Lifting;Writing;House hold activities    Patient Stated Goals  Increase L shoulder ROM/ strength.      Currently in Pain?  Yes    Pain Score  4     Pain Location  Shoulder    Pain Orientation  Left    Pain Descriptors / Indicators  Aching    Pain Type  Chronic pain    Pain Score  8    Pain Location  Back    Pain Orientation  Left;Lower    Pain Descriptors / Indicators  Aching    Pain Type  Chronic pain         Treatment   Ther Ex:  B UBE 2 min. F/b/f/b (warm-up).   Standing shoulder flexion/ abd. With wand At wall 10x2 each (cuing for posture/ technique).   Nautilus: 60# Lat pull  downs 15x2 60# Tricep Ext.15x2 60# Shoulder Adduction10x2  Supine IR/ER Shoulder Isometrics 2x10 with 10 sec holds Supine L shoulder flexion/abduction Isometrics at end range, 2x10 with 10 sec holds Supine 5# L shoulder press-ups/ 3# ER 20x each (pain tolerable)  Manual Therapy:  SupineL shoulder AA/PROM to end range for Flexion, Abduction, ER/IR x32min; Supine L shoulder AP/inf./ lateral mobs 5x30 sec. (hypomobility with increase noted during AP).   STM to L anterior deltoid/ pec. Musculature (as tolerated).      PT Long Term Goals - 02/15/18 1206      PT LONG TERM GOAL #1   Title  Pt. will increase FOTO from 20 to 60 to improve pain-free mobility.      Baseline  FOTO baseline on 10/17: 20    Time  8    Period  Weeks    Status  New    Target Date  04/01/18      PT LONG TERM GOAL #2   Title  Pt. will increase L shoulder AROM to WNL as compared to R shoulder to improve pain-free mobility.      Baseline  L shoulder PROM: Flexion (152 deg), abduction (136 deg), ER (59 deg), IR  (91 deg).     Time  8    Period  Weeks    Status  New    Target Date  04/01/18      PT LONG TERM GOAL #3   Title  Pt. will progress L shoulder strength to grossly 4+/5 MMT to improve carrying/ lifting tasks.     Baseline  Unable to assess at this time per MD protocol.    Time  8    Period  Weeks    Status  New    Target Date  04/01/18         Plan - 03/08/18 0712    Clinical Impression Statement  Great tx. session with consistent increase in L shoulder flexion/ abduction in standing posture at wall.  Pt. requires use of SPC on R with all aspect of gait secondary to L SI/low back pain.  Increase L shoulder AP grade mobility today with sh. abd. to 90 deg.  Pain limited with ER/ contract-relax stretches.      Clinical Presentation  Evolving    Clinical Decision Making  Moderate    Rehab Potential  Fair    PT Frequency  1x / week    PT Duration  8 weeks    PT Treatment/Interventions  ADLs/Self Care Home Management;Moist Heat;Cryotherapy;Electrical Stimulation;Therapeutic activities;Functional mobility training;Therapeutic exercise;Neuromuscular re-education;Patient/family education;Manual techniques;Scar mobilization;Passive range of motion    PT Next Visit Plan  Continued joint mobilzations.  Progress sh. stability.      PT Home Exercise Plan  see notes.       Patient will benefit from skilled therapeutic intervention in order to improve the following deficits and impairments:  Decreased mobility, Hypomobility, Decreased scar mobility, Decreased range of motion, Decreased activity tolerance, Decreased strength, Pain, Postural dysfunction, Impaired UE functional use, Impaired flexibility  Visit Diagnosis: Chronic left shoulder pain  Muscle weakness (generalized)  Shoulder joint stiffness, left     Problem List Patient Active Problem List   Diagnosis Date Noted  . Chest pain 09/12/2014   Cammie Mcgee, PT, DPT # [id number] 03/08/2018, 10:35 AM  [place] [facility] [address] [place], [place], [address] Phone: [phone]   Fax:  [phone]  Name: Jadan Hinojos MRN: [id number] Date of Birth:  12/20/1960   

## 2018-03-15 ENCOUNTER — Ambulatory Visit: Payer: Medicaid Other | Admitting: Physical Therapy

## 2018-03-15 ENCOUNTER — Encounter: Payer: Self-pay | Admitting: Physical Therapy

## 2018-03-15 NOTE — Therapy (Signed)
[place] [facility] [address] [place], [place], [address] Phone: [phone]   Fax:  [phone]  Physical Therapy Treatment  Patient Details  Name: Ethan Rosales MRN: [id number] Date of Birth: [birth year] Referring Provider (PT): Dr. Roney Mansreighton   Encounter Date: 03/15/2018   Past Medical History:  Diagnosis Date  . Acid reflux   . Arthritis    "probably in my knees" (09/12/2014)  . Chronic lower back pain   . Complication of anesthesia    "anesthesia burndt my skin in ~ 04/2014 when I had colonoscopy"  . GERD (gastroesophageal reflux disease)   . Heart murmur    "when I was little"  . High cholesterol   . Lumbar herniated disc    "L3, 4, 5" (09/12/2014)  . Myocardial infarction (HCC) 2003  . Pneumonia    "once when I was real little"  . Refusal of blood transfusions as patient is Jehovah's Witness   . Shortness of breath    ' AT TIMES "  . Stroke [facility](HCC) 2003   LEFT SIDE RESIDUAL (09/12/2014)  . Type II diabetes mellitus (HCC)     Past Surgical History:  Procedure Laterality Date  . CARDIAC CATHETERIZATION  02/25/2013  . FOOT SURGERY Bilateral 1999   "took bones out of my toes so I could walk"  . LEFT HEART CATHETERIZATION WITH CORONARY ANGIOGRAM N/A 02/25/2013   Procedure: LEFT HEART CATHETERIZATION WITH CORONARY ANGIOGRAM;  Surgeon: Robynn PaneMohan N Harwani, MD;  [facility];  Service: Cardiovascular;  Laterality: N/A;    There were no vitals filed for this visit.  Subjective Assessment - 03/15/18 0704    Subjective  No call/ No show for scheduled PT appt.      Pertinent History  SI joint fusion on L in 2017.  Pt. is filing for disability due to walking limitations.  Pt. enjoys fishing but unable to do right now due to L shoulder limitations.  Lives with 55 y/o father (had a stroke and dementia).        Limitations  Lifting;Writing;House hold activities    Patient Stated Goals  Increase L shoulder ROM/ strength.      Currently in Pain?  --          No treatment today.  PT called pt. And left message.     PT Long Term Goals - 02/15/18 1206      PT LONG TERM GOAL #1   Title  Pt. will increase FOTO from 20 to 60 to improve pain-free mobility.      Baseline  FOTO baseline on 10/17: 20    Time  8    Period  Weeks    Status  New    Target Date  04/01/18      PT LONG TERM GOAL #2   Title  Pt. will increase L shoulder AROM to WNL as compared to R shoulder to improve pain-free mobility.      Baseline  L shoulder PROM: Flexion (152 deg), abduction (136 deg), ER (59 deg), IR (91 deg).     Time  8    Period  Weeks    Status  New    Target Date  04/01/18      PT LONG TERM GOAL #3   Title  Pt. will progress L shoulder strength to grossly 4+/5 MMT to improve carrying/ lifting tasks.     Baseline  Unable to assess at this time per MD protocol.    Time  8    Period  Weeks    Status  New    Target Date  04/01/18            Plan - 03/15/18 0704    Clinical Impression Statement  No call/ No show.   Treatment note arrived on accident.      Clinical Presentation  Evolving    Clinical Decision Making  Moderate    Rehab Potential  Fair    PT Frequency  1x / week    PT Duration  8 weeks    PT Treatment/Interventions  ADLs/Self Care Home Management;Moist Heat;Cryotherapy;Electrical Stimulation;Therapeutic activities;Functional mobility training;Therapeutic exercise;Neuromuscular re-education;Patient/family education;Manual techniques;Scar mobilization;Passive range of motion    PT Next Visit Plan  Continued joint mobilzations.  Progress sh. stability.         Patient will benefit from skilled therapeutic intervention in order to improve the following deficits and impairments:  Decreased mobility, Hypomobility, Decreased scar mobility, Decreased range of motion, Decreased activity tolerance, Decreased strength, Pain, Postural dysfunction, Impaired UE functional use, Impaired flexibility  Visit  Diagnosis: Chronic left shoulder pain  Muscle weakness (generalized)  Shoulder joint stiffness, left     Problem List Patient Active Problem List   Diagnosis Date Noted  . Chest pain 09/12/2014   Cammie Mcgee, PT, DPT # [phone] 03/15/2018, 7:55 AM  [place] [facility] [address] [place], [place], [address] Phone: [phone]   Fax:  [phone]  Name: Ethan Rosales MRN: [id number] Date of Birth: 1961/03/19

## 2018-03-16 ENCOUNTER — Encounter: Payer: Medicaid Other | Admitting: Physical Therapy

## 2018-03-22 ENCOUNTER — Encounter: Payer: Self-pay | Admitting: Physical Therapy

## 2018-03-22 ENCOUNTER — Ambulatory Visit: Payer: Medicaid Other | Admitting: Physical Therapy

## 2018-03-22 DIAGNOSIS — M25612 Stiffness of left shoulder, not elsewhere classified: Secondary | ICD-10-CM

## 2018-03-22 DIAGNOSIS — M25512 Pain in left shoulder: Secondary | ICD-10-CM | POA: Diagnosis not present

## 2018-03-22 DIAGNOSIS — G8929 Other chronic pain: Secondary | ICD-10-CM

## 2018-03-22 DIAGNOSIS — M6281 Muscle weakness (generalized): Secondary | ICD-10-CM

## 2018-03-22 NOTE — Therapy (Signed)
[place] [facility] [address] [place], [place], [address] Phone: [phone]   Fax:  [phone]  Physical Therapy Treatment  Patient Details  Name: Ethan Rosales MRN: [id number] Date of Birth: 20-Feb-1961 Referring Provider (PT): Dr. Roney Mansreighton   Encounter Date: 03/22/2018  PT End of Session - 03/22/18 0711    Visit Number  23    Number of Visits  27    Date for PT Re-Evaluation  04/01/18    PT Start Time  0706    PT Stop Time  0759    PT Time Calculation (min)  53 min    Activity Tolerance  Patient limited by pain    Behavior During Therapy  [facility] for tasks assessed/performed       Past Medical History:  Diagnosis Date  . Acid reflux   . Arthritis    "probably in my knees" (09/12/2014)  . Chronic lower back pain   . Complication of anesthesia    "anesthesia burndt my skin in ~ 04/2014 when I had colonoscopy"  . GERD (gastroesophageal reflux disease)   . Heart murmur    "when I was little"  . High cholesterol   . Lumbar herniated disc    "L3, 4, 5" (09/12/2014)  . Myocardial infarction (HCC) 2003  . Pneumonia    "once when I was real little"  . Refusal of blood transfusions as patient is Jehovah's Witness   . Shortness of breath    ' AT TIMES "  . Stroke [facility](HCC) 2003   LEFT SIDE RESIDUAL (09/12/2014)  . Type II diabetes mellitus (HCC)     Past Surgical History:  Procedure Laterality Date  . CARDIAC CATHETERIZATION  02/25/2013  . FOOT SURGERY Bilateral 1999   "took bones out of my toes so I could walk"  . LEFT HEART CATHETERIZATION WITH CORONARY ANGIOGRAM N/A 02/25/2013   Procedure: LEFT HEART CATHETERIZATION WITH CORONARY ANGIOGRAM;  Surgeon: Robynn PaneMohan N Harwani, MD;  [facility];  Service: Cardiovascular;  Laterality: N/A;    There were no vitals filed for this visit.  Subjective Assessment - 03/22/18 0710    Subjective  Pt. states he was sick for 3 days in GI last week which is why he missed PT.  Pt. reports  pulsing/throbbing pain in L shoulder over past 2 days.  Pt. reports 5/10 L shoulder pain currently at rest.      Pertinent History  SI joint fusion on L in 2017.  Pt. is filing for disability due to walking limitations.  Pt. enjoys fishing but unable to do right now due to L shoulder limitations.  Lives with 55 y/o father (had a stroke and dementia).        Limitations  Lifting;Writing;House hold activities    Patient Stated Goals  Increase L shoulder ROM/ strength.      Currently in Pain?  Yes    Pain Score  5     Pain Location  Shoulder    Pain Orientation  Left    Pain Descriptors / Indicators  Aching;Throbbing    Pain Type  Chronic pain       MH to L shoulder prior to tx. Session   Treatment   Ther Ex:  B UBE 2 min. F/b/f/b (warm-up).   Seated/ standing shoulder flexion/ abd. With [facility]x2 each (cuing for posture/ technique).   Nautilus: 70# Lat pull downs (seated) 30x 60# Tricep Ext. (seated)30x 60# Shoulder Adduction (standing)20x 70# Scap. Retraction (standing) 15x2  Supine IR/ER Shoulder Isometrics 2x10 with 10 sec holds Supine L shoulder flexion/abduction Isometrics at end range, 2x10 with 10 sec holds Supine 5# L shoulder press-ups/ 3# ER 20x each (pain tolerable)  Manual Therapy:  SupineL shoulder AA/PROM to end range for Flexion, Abduction, ER/IR x107min; Supine L shoulder AP/inf./ lateral mobs 5x30 sec. (hypomobility with increase noted during AP).  STM to L anterior deltoid/ pec. Musculature (as tolerated).      PT Long Term Goals - 02/15/18 1206      PT LONG TERM GOAL #1   Title  Pt. will increase FOTO from 20 to 60 to improve pain-free mobility.      Baseline  FOTO baseline on 10/17: 20    Time  8    Period  Weeks    Status  New    Target Date  04/01/18      PT LONG TERM GOAL #2   Title  Pt. will increase L shoulder AROM to WNL as compared to R shoulder to improve pain-free mobility.      Baseline  L shoulder PROM: Flexion (152  deg), abduction (136 deg), ER (59 deg), IR (91 deg).     Time  8    Period  Weeks    Status  New    Target Date  04/01/18      PT LONG TERM GOAL #3   Title  Pt. will progress L shoulder strength to grossly 4+/5 MMT to improve carrying/ lifting tasks.     Baseline  Unable to assess at this time per MD protocol.    Time  8    Period  Weeks    Status  New    Target Date  04/01/18            Plan - 03/22/18 3086    Clinical Impression Statement  Overall tx. progression limited by c/o nausea/ GI issues over past week.  Pt. returns to GI specialist after PT appt. today at [facility].  Good L shoulder AROM (all planes) and working hard during resisted/ strength training.  No change to HEP and PT will reassess goals next tx. to determine recert vs. discharge.      Clinical Presentation  Evolving    Clinical Decision Making  Moderate    Rehab Potential  Fair    PT Frequency  1x / week    PT Duration  8 weeks    PT Treatment/Interventions  ADLs/Self Care Home Management;Moist Heat;Cryotherapy;Electrical Stimulation;Therapeutic activities;Functional mobility training;Therapeutic exercise;Neuromuscular re-education;Patient/family education;Manual techniques;Scar mobilization;Passive range of motion    PT Next Visit Plan  Continued joint mobilzations.  Progress sh. stability.  CHECK GOALS    PT Home Exercise Plan  see notes.    Consulted and Agree with Plan of Care  Patient       Patient will benefit from skilled therapeutic intervention in order to improve the following deficits and impairments:  Decreased mobility, Hypomobility, Decreased scar mobility, Decreased range of motion, Decreased activity tolerance, Decreased strength, Pain, Postural dysfunction, Impaired UE functional use, Impaired flexibility  Visit Diagnosis: Chronic left shoulder pain  Muscle weakness (generalized)  Shoulder joint stiffness, left     Problem List Patient Active Problem List   Diagnosis Date Noted  .  Chest pain 09/12/2014   Cammie Mcgee, PT, DPT # [phone] 03/22/2018, 8:07 AM  [place] [facility] [address] [place], [place], [address] Phone: [phone]   Fax:  [phone]  Name: Ethan Cipro  MRN: [id number] Date of Birth: 1960-05-28

## 2018-03-29 ENCOUNTER — Encounter: Payer: Self-pay | Admitting: Physical Therapy

## 2018-03-29 ENCOUNTER — Ambulatory Visit: Payer: Medicaid Other | Attending: Orthopedic Surgery | Admitting: Physical Therapy

## 2018-03-29 DIAGNOSIS — M6281 Muscle weakness (generalized): Secondary | ICD-10-CM | POA: Diagnosis present

## 2018-03-29 DIAGNOSIS — M25612 Stiffness of left shoulder, not elsewhere classified: Secondary | ICD-10-CM | POA: Insufficient documentation

## 2018-03-29 DIAGNOSIS — M25512 Pain in left shoulder: Secondary | ICD-10-CM | POA: Insufficient documentation

## 2018-03-29 DIAGNOSIS — G8929 Other chronic pain: Secondary | ICD-10-CM | POA: Diagnosis present

## 2018-03-29 NOTE — Therapy (Signed)
[place] [facility] [address] [place], [place], [address] Phone: [phone]   Fax:  [phone]  Physical Therapy Treatment  Patient Details  Name: Ethan Rosales MRN: [id number] Date of Birth: January 12, 1961 Referring Provider (PT): Dr. Jeannie Fend   Encounter Date: 03/29/2018  PT End of Session - 03/29/18 0719    Visit Number  24    Number of Visits  27    Date for PT Re-Evaluation  04/26/18    PT Start Time  0711    PT Stop Time  0759    PT Time Calculation (min)  48 min    Activity Tolerance  Patient limited by pain    Behavior During Therapy  [facility] for tasks assessed/performed       Past Medical History:  Diagnosis Date  . Acid reflux   . Arthritis    "probably in my knees" (09/12/2014)  . Chronic lower back pain   . Complication of anesthesia    "anesthesia burndt my skin in ~ 04/2014 when I had colonoscopy"  . GERD (gastroesophageal reflux disease)   . Heart murmur    "when I was little"  . High cholesterol   . Lumbar herniated disc    "L3, 4, 5" (09/12/2014)  . Myocardial infarction ([place]) 2003  . Pneumonia    "once when I was real little"  . Refusal of blood transfusions as patient is Jehovah's Witness   . Shortness of breath    ' AT TIMES "  . Stroke [facility]) 2003   LEFT SIDE RESIDUAL (09/12/2014)  . Type II diabetes mellitus ([place])     Past Surgical History:  Procedure Laterality Date  . CARDIAC CATHETERIZATION  02/25/2013  . FOOT SURGERY Bilateral 1999   "took bones out of my toes so I could walk"  . LEFT HEART CATHETERIZATION WITH CORONARY ANGIOGRAM N/A 02/25/2013   Procedure: LEFT HEART CATHETERIZATION WITH CORONARY ANGIOGRAM;  Surgeon: Clent Demark, MD;  Location: [facility];  Service: Cardiovascular;  Laterality: N/A;    There were no vitals filed for this visit.  Subjective Assessment - 03/29/18 0716    Subjective  Pt. was busy all weekend at hospital with father.  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   Pt. states back is flare-up from lying in hospital chair.  Pt. states he was approved for disability due to back issues.      Pertinent History  SI joint fusion on L in 2017.  Pt. is filing for disability due to walking limitations.  Pt. enjoys fishing but unable to do right now due to L shoulder limitations.  Lives with 58 y/o father (had a stroke and dementia).        Limitations  Lifting;Writing;House hold activities    Patient Stated Goals  Increase L shoulder ROM/ strength.      Currently in Pain?  Yes    Pain Score  5     Pain Location  Shoulder    Pain Orientation  Left    Pain Descriptors / Indicators  Aching;Throbbing    Pain Type  Chronic pain    Pain Score  8    Pain Location  Back    Pain Orientation  Left;Lower    Pain Descriptors / Indicators  Aching    Pain Type  Chronic pain         [facility] PT Assessment - 03/29/18 0001      Assessment   Medical Diagnosis  S/p L shoulder  biceps tenodesis/ arthrocopy, L shoulder manipulation under anesthesia, L shoulder pain    Referring Provider (PT)  Dr. Jeannie Fend    Onset Date/Surgical Date  02/09/18    Prior Therapy  Yes      Prior Function   Level of Independence  Independent      Treatment   Ther Ex:  B UBE 2 min. F/b/f/b (warm-up).   Nautilus: 70# Lat pull downs (seated) 30x 60# Tricep Ext. (seated)30x 60# Shoulder Adduction (standing)20x 70# Scap. Retraction (standing) 15x2  BTB B sh. Diagonals 10x3 (good technique/ seated posture). Supine IR/ER Shoulder Isometrics 2x10 with 10 sec holds Supine L shoulder flexion/abduction Isometrics at end range, 2x10 with 10 sec holds Seated 5# B shoulder press-ups/ 5# bicep curls 20x (pain tolerable/ moderate L sh. fatigue)  Manual Therapy:  SupineL shoulder AA/PROM to end range for Flexion, Abduction, ER/IR x6 min; Supine L shoulder AP/inf./ lateral mobs 5x30 sec. STM to L anterior deltoid/ pec. Musculature (as tolerated).     PT Long Term Goals -  03/29/18 0801      PT LONG TERM GOAL #1   Title  Pt. will increase FOTO from 20 to 60 to improve pain-free mobility.      Baseline  FOTO baseline on 10/17: 20.  12/2: 41    Time  4    Period  Weeks    Status  Partially Met    Target Date  04/26/18      PT LONG TERM GOAL #2   Title  Pt. will increase L shoulder AROM to WNL as compared to R shoulder to improve pain-free mobility.      Baseline  Supine L shoulder AROM: flexion: 152 deg./ abduction: 134 deg./ ER: 54 deg. (discomfort)/ IR: 74 deg. Seated L shoulder AROM: flexion: 152 deg./ abduction: 132 deg. L shoulder strength: flexion: 4/5 MMT/ abduction: 4+/5 MMT.    Time  4    Period  Weeks    Status  Partially Met    Target Date  04/26/18      PT LONG TERM GOAL #3   Title  Pt. will progress L shoulder strength to grossly 4+/5 MMT to improve carrying/ lifting tasks.     Baseline  L shoulder strength: flexion: 4/5 MMT/ abduction: 4+/5 MMT.    Time  4    Period  Weeks    Status  Partially Met    Target Date  04/26/18      PT LONG TERM GOAL #4   Title  Pt. able to return to fishing with no L shoulder/elbow pain or limitations.      Baseline  attempted and lasted 20 minutes (pain).  Back limitations    Time  4    Period  Weeks    Status  Partially Met    Target Date  04/26/18         Plan - 03/29/18 0719    Clinical Impression Statement  Pt. has shown consistent progress with L shoulder AROM (all planes) with discomfort reported at end-range.  Supine L shoulder AROM:  flexion: 152 deg./ abduction: 134 deg./ ER: 54 deg. (discomfort)/ IR: 74 deg.  Seated L shoulder AROM: flexion: 152 deg./ abduction: 132 deg.  L shoulder strength: flexion: 4/5 MMT/ abduction: 4+/5 MMT.  FOTO: initial 30/ today 41/ goal 60.  Pt. has 3 more PT appts. scheduled to focus on L shoulder strengthening progression/ pain mgmt.      Clinical Presentation  Evolving    Clinical Decision  Making  Moderate    Rehab Potential  Fair    PT Frequency  1x / week     PT Duration  8 weeks    PT Treatment/Interventions  ADLs/Self Care Home Management;Moist Heat;Cryotherapy;Electrical Stimulation;Therapeutic activities;Functional mobility training;Therapeutic exercise;Neuromuscular re-education;Patient/family education;Manual techniques;Scar mobilization;Passive range of motion    PT Next Visit Plan  Continued joint mobilzations.  Progress sh. stability.  Discuss MD f/u    PT Home Exercise Plan  see notes.    Consulted and Agree with Plan of Care  Patient       Patient will benefit from skilled therapeutic intervention in order to improve the following deficits and impairments:  Decreased mobility, Hypomobility, Decreased scar mobility, Decreased range of motion, Decreased activity tolerance, Decreased strength, Pain, Postural dysfunction, Impaired UE functional use, Impaired flexibility  Visit Diagnosis: Chronic left shoulder pain  Muscle weakness (generalized)  Shoulder joint stiffness, left     Problem List Patient Active Problem List   Diagnosis Date Noted  . Chest pain 09/12/2014   Pura Spice, PT, DPT # [phone] 03/29/2018, 8:04 AM  [place] [facility] [address] [place], [place], [address] Phone: [phone]   Fax:  [phone]  Name: Ethan Rosales MRN: [id number] Date of Birth: [birth year]

## 2018-04-05 ENCOUNTER — Ambulatory Visit: Payer: Medicaid Other | Admitting: Physical Therapy

## 2018-04-05 ENCOUNTER — Encounter: Payer: Self-pay | Admitting: Physical Therapy

## 2018-04-05 DIAGNOSIS — M6281 Muscle weakness (generalized): Secondary | ICD-10-CM

## 2018-04-05 DIAGNOSIS — M25512 Pain in left shoulder: Secondary | ICD-10-CM | POA: Diagnosis not present

## 2018-04-05 DIAGNOSIS — M25612 Stiffness of left shoulder, not elsewhere classified: Secondary | ICD-10-CM

## 2018-04-05 DIAGNOSIS — G8929 Other chronic pain: Secondary | ICD-10-CM

## 2018-04-05 NOTE — Therapy (Signed)
[place] [facility] [address] [place], [place], [address] Phone: [phone]   Fax:  [phone]  Physical Therapy Treatment  Patient Details  Name: Ethan Rosales MRN: [id number] Date of Birth: 01/30/1961 Referring Provider (PT): Dr. Jeannie Fend   Encounter Date: 04/05/2018  PT End of Session - 04/05/18 0704    Visit Number  25    Number of Visits  27    Date for PT Re-Evaluation  04/26/18    PT Start Time  0710    PT Stop Time  0801    PT Time Calculation (min)  51 min    Activity Tolerance  Patient limited by pain;Patient tolerated treatment well    Behavior During Therapy  [facility] for tasks assessed/performed       Past Medical History:  Diagnosis Date  . Acid reflux   . Arthritis    "probably in my knees" (09/12/2014)  . Chronic lower back pain   . Complication of anesthesia    "anesthesia burndt my skin in ~ 04/2014 when I had colonoscopy"  . GERD (gastroesophageal reflux disease)   . Heart murmur    "when I was little"  . High cholesterol   . Lumbar herniated disc    "L3, 4, 5" (09/12/2014)  . Myocardial infarction ([place]) 2003  . Pneumonia    "once when I was real little"  . Refusal of blood transfusions as patient is Jehovah's Witness   . Shortness of breath    ' AT TIMES "  . Stroke [facility]) 2003   LEFT SIDE RESIDUAL (09/12/2014)  . Type II diabetes mellitus ([place])     Past Surgical History:  Procedure Laterality Date  . CARDIAC CATHETERIZATION  02/25/2013  . FOOT SURGERY Bilateral 1999   "took bones out of my toes so I could walk"  . LEFT HEART CATHETERIZATION WITH CORONARY ANGIOGRAM N/A 02/25/2013   Procedure: LEFT HEART CATHETERIZATION WITH CORONARY ANGIOGRAM;  Surgeon: Clent Demark, MD;  Location: [facility];  Service: Cardiovascular;  Laterality: N/A;    There were no vitals filed for this visit.  Subjective Assessment - 04/05/18 0703    Subjective  Pt. had to cancel Dr. Jeannie Fend f/u visit last Friday  secondary to GI issues.  Pt. states she discomfort is "pulsating" and back pain remains persistent.      Pertinent History  SI joint fusion on L in 2017.  Pt. is filing for disability due to walking limitations.  Pt. enjoys fishing but unable to do right now due to L shoulder limitations.  Lives with 43 y/o father (had a stroke and dementia).        Limitations  Lifting;Writing;House hold activities    Patient Stated Goals  Increase L shoulder ROM/ strength.      Currently in Pain?  Yes    Pain Score  5     Pain Location  Back    Pain Orientation  Lower;Left    Pain Descriptors / Indicators  Aching    Pain Type  Chronic pain         Treatment   Ther Ex:  Scifit L6 12 min. B UE/LE (warm-up/ no charge).    Nautilus: 50# B sh. Adduction 20x 70# Lat pull downs(seated) 30x 60# Tricep Ext.(seated)30x 50# Shoulder extension(standing)20x 70# Scap. Retraction (standing)   Seated 5# B shoulder press-ups/ 5# bicep curls 20x (good technique) BTB B sh. Diagonals 10x3 (good technique/ seated posture). Standing cone overhead reaching (top shelf)- 2x (  pt. Reports a "muscle burn")  No manual tx. Today.       PT Long Term Goals - 03/29/18 0801      PT LONG TERM GOAL #1   Title  Pt. will increase FOTO from 20 to 60 to improve pain-free mobility.      Baseline  FOTO baseline on 10/17: 20.  12/2: 41    Time  4    Period  Weeks    Status  Partially Met    Target Date  04/26/18      PT LONG TERM GOAL #2   Title  Pt. will increase L shoulder AROM to WNL as compared to R shoulder to improve pain-free mobility.      Baseline  Supine L shoulder AROM: flexion: 152 deg./ abduction: 134 deg./ ER: 54 deg. (discomfort)/ IR: 74 deg. Seated L shoulder AROM: flexion: 152 deg./ abduction: 132 deg. L shoulder strength: flexion: 4/5 MMT/ abduction: 4+/5 MMT.    Time  4    Period  Weeks    Status  Partially Met    Target Date  04/26/18      PT LONG TERM GOAL #3   Title  Pt. will  progress L shoulder strength to grossly 4+/5 MMT to improve carrying/ lifting tasks.     Baseline  L shoulder strength: flexion: 4/5 MMT/ abduction: 4+/5 MMT.    Time  4    Period  Weeks    Status  Partially Met    Target Date  04/26/18      PT LONG TERM GOAL #4   Title  Pt. able to return to fishing with no L shoulder/elbow pain or limitations.      Baseline  attempted and lasted 20 minutes (pain).  Back limitations    Time  4    Period  Weeks    Status  Partially Met    Target Date  04/26/18            Plan - 04/05/18 0704    Clinical Impression Statement  Pt. entered PT with use of SPC and demonstrates good/ functional L shoulder AROM (all planes).  Good overhead reaching at 87" shelf with cones and proper technique/ scapular mobility.  No manual tx. today with focus on shoulder/ scapular strengthening.      Clinical Presentation  Evolving    Clinical Decision Making  Moderate    Rehab Potential  Fair    PT Frequency  1x / week    PT Duration  4 weeks    PT Treatment/Interventions  ADLs/Self Care Home Management;Moist Heat;Cryotherapy;Electrical Stimulation;Therapeutic activities;Functional mobility training;Therapeutic exercise;Neuromuscular re-education;Patient/family education;Manual techniques;Scar mobilization;Passive range of motion    PT Next Visit Plan  Continued joint mobilzations.  Progress sh. stability.  MD appt. 12/16 after last PT appt.      PT Home Exercise Plan  see notes.       Patient will benefit from skilled therapeutic intervention in order to improve the following deficits and impairments:  Decreased mobility, Hypomobility, Decreased scar mobility, Decreased range of motion, Decreased activity tolerance, Decreased strength, Pain, Postural dysfunction, Impaired UE functional use, Impaired flexibility  Visit Diagnosis: Chronic left shoulder pain  Muscle weakness (generalized)  Shoulder joint stiffness, left     Problem List Patient Active Problem  List   Diagnosis Date Noted  . Chest pain 09/12/2014   Pura Spice, PT, DPT # [phone] 04/05/2018, 8:21 AM  [place]  [address] [place], [place], [address] Phone: [phone]   Fax:  [phone]  Name: Ethan Rosales MRN: [id number] Date of Birth: 18-Jun-1960

## 2018-04-12 ENCOUNTER — Encounter: Payer: Self-pay | Admitting: Physical Therapy

## 2018-04-12 ENCOUNTER — Ambulatory Visit: Payer: Medicaid Other | Admitting: Physical Therapy

## 2018-04-12 DIAGNOSIS — M25612 Stiffness of left shoulder, not elsewhere classified: Secondary | ICD-10-CM

## 2018-04-12 DIAGNOSIS — G8929 Other chronic pain: Secondary | ICD-10-CM

## 2018-04-12 DIAGNOSIS — M25512 Pain in left shoulder: Principal | ICD-10-CM

## 2018-04-12 DIAGNOSIS — M6281 Muscle weakness (generalized): Secondary | ICD-10-CM

## 2018-04-12 NOTE — Therapy (Signed)
[facility] [address]. [place], [place], [address] Phone: [phone]   Fax:  [phone]  Physical Therapy Treatment  Patient Details  Name: Ethan Rosales MRN: [id number] Date of Birth: December 21, 1960 Referring Provider (PT): Dr. Jeannie Fend   Encounter Date: 04/12/2018  PT End of Session - 04/12/18 0716    Visit Number  26    Number of Visits  27    Date for PT Re-Evaluation  04/26/18    PT Start Time  0705    PT Stop Time  0802    PT Time Calculation (min)  57 min    Activity Tolerance  Patient limited by pain;Patient tolerated treatment well    Behavior During Therapy  [facility] for tasks assessed/performed       Past Medical History:  Diagnosis Date  . Acid reflux   . Arthritis    "probably in my knees" (09/12/2014)  . Chronic lower back pain   . Complication of anesthesia    "anesthesia burndt my skin in ~ 04/2014 when I had colonoscopy"  . GERD (gastroesophageal reflux disease)   . Heart murmur    "when I was little"  . High cholesterol   . Lumbar herniated disc    "L3, 4, 5" (09/12/2014)  . Myocardial infarction ([place]) 2003  . Pneumonia    "once when I was real little"  . Refusal of blood transfusions as patient is Jehovah's Witness   . Shortness of breath    ' AT TIMES "  . Stroke [facility]) 2003   LEFT SIDE RESIDUAL (09/12/2014)  . Type II diabetes mellitus ([place])     Past Surgical History:  Procedure Laterality Date  . CARDIAC CATHETERIZATION  02/25/2013  . FOOT SURGERY Bilateral 1999   "took bones out of my toes so I could walk"  . LEFT HEART CATHETERIZATION WITH CORONARY ANGIOGRAM N/A 02/25/2013   Procedure: LEFT HEART CATHETERIZATION WITH CORONARY ANGIOGRAM;  Surgeon: Clent Demark, MD;  Location: [place] CATH LAB;  Service: Cardiovascular;  Laterality: N/A;    There were no vitals filed for this visit.  Subjective Assessment - 04/12/18 0713    Subjective  Pt. reports 7-8/10 back pain and 7/10 L shoulder pain  prior to tx. session.  Pt. states the pain is tolerable but persistently hurts.      Pertinent History  SI joint fusion on L in 2017.  Pt. is filing for disability due to walking limitations.  Pt. enjoys fishing but unable to do right now due to L shoulder limitations.  Lives with 55 y/o father (had a stroke and dementia).        Limitations  Lifting;Writing;House hold activities    Patient Stated Goals  Increase L shoulder ROM/ strength.      Currently in Pain?  Yes    Pain Score  7     Pain Location  Back    Pain Orientation  Lower;Left    Pain Descriptors / Indicators  Aching    Pain Type  Chronic pain    Pain Score  7    Pain Location  Shoulder    Pain Orientation  Left    Pain Descriptors / Indicators  Aching    Pain Type  Chronic pain             Treatment   Ther Ex:  Scifit L6.5 10 min. B UE/LE (warm-up/ no charge).    Nautilus: 70# Lat pull downs(seated) 30x 60# Tricep Ext.(standing)30x 50#  Shoulder extension(standing)20x 70# Scap. Retraction (standing)   Seated5# Bshoulder press-ups/ 5#bicep curls 20x(good technique)- moderate L shoulder fatigue during 2nd set of 10 reps.   BTB B sh. Diagonals 10x3 (good technique/ seated posture).  Seated/supine L shoulder ROM reassessment.  (+) L anterior/posterior deltoid tenderness with palpation.   Discussed [employer] ex. program      PT Long Term Goals - 03/29/18 0801      PT LONG TERM GOAL #1   Title  Pt. will increase FOTO from 20 to 60 to improve pain-free mobility.      Baseline  FOTO baseline on 10/17: 20.  12/2: 41    Time  4    Period  Weeks    Status  Partially Met    Target Date  04/26/18      PT LONG TERM GOAL #2   Title  Pt. will increase L shoulder AROM to WNL as compared to R shoulder to improve pain-free mobility.      Baseline  Supine L shoulder AROM: flexion: 152 deg./ abduction: 134 deg./ ER: 54 deg. (discomfort)/ IR: 74 deg. Seated L shoulder AROM: flexion: 152 deg./ abduction: 132  deg. L shoulder strength: flexion: 4/5 MMT/ abduction: 4+/5 MMT.    Time  4    Period  Weeks    Status  Partially Met    Target Date  04/26/18      PT LONG TERM GOAL #3   Title  Pt. will progress L shoulder strength to grossly 4+/5 MMT to improve carrying/ lifting tasks.     Baseline  L shoulder strength: flexion: 4/5 MMT/ abduction: 4+/5 MMT.    Time  4    Period  Weeks    Status  Partially Met    Target Date  04/26/18      PT LONG TERM GOAL #4   Title  Pt. able to return to fishing with no L shoulder/elbow pain or limitations.      Baseline  attempted and lasted 20 minutes (pain).  Back limitations    Time  4    Period  Weeks    Status  Partially Met    Target Date  04/26/18            Plan - 04/12/18 0716    Clinical Impression Statement  Seated/ supine L shoulder AROM: flexion: 134/152 deg., abduction: 118/132 deg., ER: 54 deg. (pain limited), IR: 74 deg.  L shoulder strength: flexion: 4/5 MMT, abduction: 4+/5 MMT.  Pt. has continued L shoulder pain with all aspects of overhead reaching/ strengthening ex. program.  Pt. has a membership at [facility] and PT encouraged pt. to progress to a more independent ex. program.  Pt. has no more authorized visits from Medicaid at this time.  Pt. will contact PT after MD f/u and over next several weeks if any regression in symptoms or questions about shoulder progression.      Clinical Presentation  Evolving    Clinical Decision Making  Moderate    Rehab Potential  Fair    PT Frequency  1x / week    PT Duration  4 weeks    PT Treatment/Interventions  ADLs/Self Care Home Management;Moist Heat;Cryotherapy;Electrical Stimulation;Therapeutic activities;Functional mobility training;Therapeutic exercise;Neuromuscular re-education;Patient/family education;Manual techniques;Scar mobilization;Passive range of motion    PT Next Visit Plan  Probable discharge with [employer] ex. program.      PT Home Exercise Plan  see notes.       Patient will benefit  from skilled therapeutic intervention  in order to improve the following deficits and impairments:  Decreased mobility, Hypomobility, Decreased scar mobility, Decreased range of motion, Decreased activity tolerance, Decreased strength, Pain, Postural dysfunction, Impaired UE functional use, Impaired flexibility  Visit Diagnosis: Chronic left shoulder pain  Muscle weakness (generalized)  Shoulder joint stiffness, left     Problem List Patient Active Problem List   Diagnosis Date Noted  . Chest pain 09/12/2014   Pura Spice, PT, DPT # [id number] 04/12/2018, 8:02 AM  [facility] [facility] [address] [place], [place], [address] Phone: [phone]   Fax:  [phone]  Name: Laronn Devonshire MRN: [id number] Date of Birth: 1960/08/03

## 2018-05-10 ENCOUNTER — Encounter: Payer: Self-pay | Admitting: Physical Therapy

## 2018-05-10 ENCOUNTER — Ambulatory Visit: Payer: Medicaid Other | Attending: Orthopedic Surgery | Admitting: Physical Therapy

## 2018-05-10 DIAGNOSIS — G8929 Other chronic pain: Secondary | ICD-10-CM | POA: Diagnosis present

## 2018-05-10 DIAGNOSIS — M25612 Stiffness of left shoulder, not elsewhere classified: Secondary | ICD-10-CM | POA: Insufficient documentation

## 2018-05-10 DIAGNOSIS — M25512 Pain in left shoulder: Secondary | ICD-10-CM | POA: Diagnosis present

## 2018-05-10 DIAGNOSIS — M6281 Muscle weakness (generalized): Secondary | ICD-10-CM | POA: Diagnosis present

## 2018-05-10 NOTE — Therapy (Signed)
[facility] [address]. [place], [place], [address] Phone: [phone]   Fax:  [phone]  Physical Therapy Evaluation  Patient Details  Name: Tanmay Halteman MRN: [id number] Date of Birth: 1960-12-01 Referring Provider (PT): Dr. Roney Mans   Encounter Date: 05/10/2018  PT End of Session - 05/10/18 0704    Visit Number  1    Number of Visits  4    Date for PT Re-Evaluation  06/07/18    PT Start Time  0701    PT Stop Time  0755    PT Time Calculation (min)  54 min    Activity Tolerance  Patient limited by pain;Patient tolerated treatment well    Behavior During Therapy  [facility] for tasks assessed/performed       Past Medical History:  Diagnosis Date  . Acid reflux   . Arthritis    "probably in my knees" (09/12/2014)  . Chronic lower back pain   . Complication of anesthesia    "anesthesia burndt my skin in ~ 04/2014 when I had colonoscopy"  . GERD (gastroesophageal reflux disease)   . Heart murmur    "when I was little"  . High cholesterol   . Lumbar herniated disc    "L3, 4, 5" (09/12/2014)  . Myocardial infarction ([facility]) 2003  . Pneumonia    "once when I was real little"  . Refusal of blood transfusions as patient is Jehovah's Witness   . Shortness of breath    ' AT TIMES "  . Stroke [facility]) 2003   LEFT SIDE RESIDUAL (09/12/2014)  . Type II diabetes mellitus ([facility])     Past Surgical History:  Procedure Laterality Date  . CARDIAC CATHETERIZATION  02/25/2013  . FOOT SURGERY Bilateral 1999   "took bones out of my toes so I could walk"  . LEFT HEART CATHETERIZATION WITH CORONARY ANGIOGRAM N/A 02/25/2013   Procedure: LEFT HEART CATHETERIZATION WITH CORONARY ANGIOGRAM;  Surgeon: Robynn Pane, MD;  Location: [facility];  Service: Cardiovascular;  Laterality: N/A;    There were no vitals filed for this visit.   Subjective Assessment - 05/10/18 0702    Subjective  Pt. c/o 7/10 L shoulder pain/ 9/10 back pain.  Pt. states  his back has been inflammed over past couple weeks.  Pt. reports he has not been exercising too much do to pain.  Pt. has not returned to gym do to not being able to afford membership.  Pt. is earning only $150 dollars a month from disability.      Pertinent History  see recent PT notes/ medical charts.      Limitations  Lifting;Writing;House hold activities    Patient Stated Goals  Increase L shoulder ROM/ strength.      Currently in Pain?  Yes    Pain Score  7     Pain Location  Shoulder    Pain Orientation  Left    Pain Descriptors / Indicators  Aching    Pain Type  Chronic pain    Pain Score  9    Pain Location  Back    Pain Orientation  Lower    Pain Descriptors / Indicators  Aching    Pain Type  Chronic pain        FOTO: initial 50/ goal 57 [facility] PT Assessment - 05/11/18 0001      Assessment   Medical Diagnosis  S/p L shoulder biceps tenodesis/ arthroscopy, L shoulder manipulation under anesthesia, L shoulder  pain    Referring Provider (PT)  Dr. Roney Mansreighton    Onset Date/Surgical Date  02/09/18    Hand Dominance  Right    Prior Therapy  Yes, known well to PT clinic.        Restrictions   Weight Bearing Restrictions  No      Balance Screen   Has the patient fallen in the past 6 months  No      Home Environment   Living Environment  Private residence      Prior Function   Level of Independence  Independent;Independent with household mobility with device      Cognition   Overall Cognitive Status  Within Functional Limits for tasks assessed       Objective measurements completed on examination: See above findings.      Treatment   Ther Ex:  Scifit L6.5 10 min. B UE/LE (warm-up/ no charge).  Nautilus: 70# Lat pull downs(seated) 30x 60# Tricep Ext.(standing)30x 50# Shoulderextension(standing)20x 70# Scap. Retraction (standing)  70# chest press (standing)  Seated/supine L shoulder ROM reassessment.  (+) L anterior/posterior deltoid  tenderness with palpation.   Reviewed HEP   PT Long Term Goals - 05/11/18 0916      PT LONG TERM GOAL #1   Title  Pt. will increase FOTO from 50 to 57 to improve pain-free mobility.      Baseline  FOTO baseline: 50     Time  4    Period  Weeks    Status  New    Target Date  06/07/18      PT LONG TERM GOAL #2   Title  Pt. will increase L shoulder AROM to WNL as compared to R shoulder to improve pain-free mobility.      Baseline  Seated/ supine L shoulder AROM: flexion: 135/146 deg., abduction: 120/134 deg., ER: 54 deg., IR: 75 deg.     Time  4    Period  Weeks    Status  New    Target Date  06/07/18      PT LONG TERM GOAL #3   Title  Pt. will progress L shoulder strength to grossly 4+/5 MMT to improve carrying/ lifting tasks.     Baseline  Grip strength: R 132#, L 71#. L shoulder strength: flexion: 4/5 MMT, abduction: 4+/5 MMT. Pt. has continued L shoulder pain    Time  4    Period  Weeks    Status  New    Target Date  06/07/18      PT LONG TERM GOAL #4   Title  Pt. able to return to fishing with no L shoulder/elbow pain or limitations.      Baseline  pain limited (>7/10)    Time  4    Period  Weeks    Status  New    Target Date  06/07/18          Plan - 05/10/18 0705    Clinical Impression Statement  Pt. is a pleasant 58 y/o male s/p L shoulder arthroscopy/ biceps tenodesis and manipulation 02/09/18.  Pt. known well to PT clinic and PT was hoping to discharge pt. to more of a gym based ex. program.  Pt. has not been seen by PT over past 3 weeks and pt. reports increase L shoulder pain/ weakness since stopping PT.  Pt. referred back to PT to focus on strength training/ pain mgmt. prior to scheduled shoulder injection on 04/10/2019.  Seated/ supine L shoulder AROM: flexion:  135/146 deg., abduction: 120/134 deg., ER: 54 deg., IR: 75 deg.  Grip strength: R 132#, L 71#.  L shoulder strength: flexion: 4/5 MMT, abduction: 4+/5 MMT.  Pt. has continued L shoulder pain (anterior  deltoid/ pectoralis muscle) with all aspects of overhead reaching/ strengthening ex. program.       Clinical Presentation  Evolving    Clinical Decision Making  Moderate    Rehab Potential  Fair    PT Frequency  1x / week    PT Duration  4 weeks    PT Treatment/Interventions  ADLs/Self Care Home Management;Moist Heat;Cryotherapy;Electrical Stimulation;Therapeutic activities;Functional mobility training;Therapeutic exercise;Neuromuscular re-education;Patient/family education;Manual techniques;Scar mobilization;Passive range of motion;Dry needling    PT Next Visit Plan  Progress L shoulder strengthening/ pain-free functional mobility.  Check [employer] authorization       Patient will benefit from skilled therapeutic intervention in order to improve the following deficits and impairments:  Decreased mobility, Hypomobility, Decreased scar mobility, Decreased range of motion, Decreased activity tolerance, Decreased strength, Pain, Postural dysfunction, Impaired UE functional use, Impaired flexibility  Visit Diagnosis: Chronic left shoulder pain  Muscle weakness (generalized)  Shoulder joint stiffness, left     Problem List Patient Active Problem List   Diagnosis Date Noted  . Chest pain 09/12/2014   Cammie McgeeMichael C Kevin Space, PT, DPT # [id number] 05/11/2018, 9:18 AM   [facility] [address] [place], [place], [address] Phone: [phone]   Fax:  [phone]  Name: Jodell Ciproverette Wethington MRN: [id number] Date of Birth: Mar 28, 1961

## 2018-05-17 ENCOUNTER — Encounter: Payer: Self-pay | Admitting: Physical Therapy

## 2018-05-17 ENCOUNTER — Ambulatory Visit: Payer: Medicaid Other | Admitting: Physical Therapy

## 2018-05-17 DIAGNOSIS — M25612 Stiffness of left shoulder, not elsewhere classified: Secondary | ICD-10-CM

## 2018-05-17 DIAGNOSIS — M6281 Muscle weakness (generalized): Secondary | ICD-10-CM

## 2018-05-17 DIAGNOSIS — G8929 Other chronic pain: Secondary | ICD-10-CM

## 2018-05-17 DIAGNOSIS — M25512 Pain in left shoulder: Secondary | ICD-10-CM | POA: Diagnosis not present

## 2018-05-17 NOTE — Therapy (Signed)
[place] [facility] [address] [place], [place], [address] Phone: [phone]   Fax:  [phone]  Physical Therapy Treatment  Patient Details  Name: Ethan Rosales MRN: [id number] Date of Birth: 01-Jun-1960 Referring Provider (PT): Dr. Roney Mansreighton   Encounter Date: 05/17/2018  PT End of Session - 05/17/18 0714    Visit Number  2    Number of Visits  4    Date for PT Re-Evaluation  06/07/18    PT Start Time  0705    PT Stop Time  0755    PT Time Calculation (min)  50 min    Activity Tolerance  Patient limited by pain;Patient tolerated treatment well    Behavior During Therapy  [facility] for tasks assessed/performed       Past Medical History:  Diagnosis Date  . Acid reflux   . Arthritis    "probably in my knees" (09/12/2014)  . Chronic lower back pain   . Complication of anesthesia    "anesthesia burndt my skin in ~ 04/2014 when I had colonoscopy"  . GERD (gastroesophageal reflux disease)   . Heart murmur    "when I was little"  . High cholesterol   . Lumbar herniated disc    "L3, 4, 5" (09/12/2014)  . Myocardial infarction ([facility]) 2003  . Pneumonia    "once when I was real little"  . Refusal of blood transfusions as patient is Jehovah's Witness   . Shortness of breath    ' AT TIMES "  . Stroke [facility]) 2003   LEFT SIDE RESIDUAL (09/12/2014)  . Type II diabetes mellitus ([facility])     Past Surgical History:  Procedure Laterality Date  . CARDIAC CATHETERIZATION  02/25/2013  . FOOT SURGERY Bilateral 1999   "took bones out of my toes so I could walk"  . LEFT HEART CATHETERIZATION WITH CORONARY ANGIOGRAM N/A 02/25/2013   Procedure: LEFT HEART CATHETERIZATION WITH CORONARY ANGIOGRAM;  Surgeon: Robynn PaneMohan N Harwani, MD;  Location: [facility] CATH [facility];  Service: Cardiovascular;  Laterality: N/A;    There were no vitals filed for this visit.  Subjective Assessment - 05/17/18 0711    Subjective  Pt. had MRI on low back and EMG on L UE.  Pt. reports 8/10 L  shoulder/UE pain and 9/10 low back pain at this time.  Pt. states the cold weather is really effecting him.      Pertinent History  see recent PT notes/ medical charts.      Limitations  Lifting;Writing;House hold activities    Patient Stated Goals  Increase L shoulder ROM/ strength.      Currently in Pain?  Yes    Pain Score  8     Pain Location  Shoulder    Pain Orientation  Left    Pain Descriptors / Indicators  Aching    Pain Type  Chronic pain    Pain Score  9    Pain Location  Back    Pain Orientation  Lower    Pain Descriptors / Indicators  Aching    Pain Type  Chronic pain          Treatment   Ther Ex:  Scifit L6.56810min. B UE/LE (warm-up/ no charge). Supine 5#: chest press/ tricep extension/ sh. Abduction/ adduction (light PT assist)/ serratus punches 20x each.    Nautilus: 70# Lat pull downs(seated) 30x 60# Tricep Ext.(standing)30x 50# B sh. Adduction with handles (standing) 20x 50# Shoulderextension(standing)20x 70# Scap. Retraction (standing)  40# Bicep  curls (standing)   Manual tx.:  Seated STM to L shoulder/ anterior deltoid/ pectoralis (4 min.) AA/PROM L shoulder: flexion/ ER/ abduction/ pec stretches with static holds  No change to HEP at this time.        PT Long Term Goals - 05/11/18 0916      PT LONG TERM GOAL #1   Title  Pt. will increase FOTO from 50 to 57 to improve pain-free mobility.      Baseline  FOTO baseline: 50     Time  4    Period  Weeks    Status  New    Target Date  06/07/18      PT LONG TERM GOAL #2   Title  Pt. will increase L shoulder AROM to WNL as compared to R shoulder to improve pain-free mobility.      Baseline  Seated/ supine L shoulder AROM: flexion: 135/146 deg., abduction: 120/134 deg., ER: 54 deg., IR: 75 deg.     Time  4    Period  Weeks    Status  New    Target Date  06/07/18      PT LONG TERM GOAL #3   Title  Pt. will progress L shoulder strength to grossly 4+/5 MMT to improve carrying/  lifting tasks.     Baseline  Grip strength: R 132#, L 71#. L shoulder strength: flexion: 4/5 MMT, abduction: 4+/5 MMT. Pt. has continued L shoulder pain    Time  4    Period  Weeks    Status  New    Target Date  06/07/18      PT LONG TERM GOAL #4   Title  Pt. able to return to fishing with no L shoulder/elbow pain or limitations.      Baseline  pain limited (>7/10)    Time  4    Period  Weeks    Status  New    Target Date  06/07/18          Plan - 05/17/18 0716    Clinical Impression Statement  Pt. continues to report high levels of L shoulder/ SI/ low back pain t/o tx. session.  Pt. demonstrates functional L shoulder AROM (all planes) including overhead reaching.  Pt. showing slow but consistent progress with resisted ther.ex. with Nautilus and 5# wt.  No change to HEP at this time.      Clinical Presentation  Evolving    Clinical Decision Making  Moderate    Rehab Potential  Fair    PT Frequency  1x / week    PT Duration  4 weeks    PT Treatment/Interventions  ADLs/Self Care Home Management;Moist Heat;Cryotherapy;Electrical Stimulation;Therapeutic activities;Functional mobility training;Therapeutic exercise;Neuromuscular re-education;Patient/family education;Manual techniques;Scar mobilization;Passive range of motion;Dry needling    PT Next Visit Plan  Progress L shoulder strengthening/ pain-free functional mobility.     PT Home Exercise Plan  see notes.       Patient will benefit from skilled therapeutic intervention in order to improve the following deficits and impairments:  Decreased mobility, Hypomobility, Decreased scar mobility, Decreased range of motion, Decreased activity tolerance, Decreased strength, Pain, Postural dysfunction, Impaired UE functional use, Impaired flexibility  Visit Diagnosis: Chronic left shoulder pain  Muscle weakness (generalized)  Shoulder joint stiffness, left     Problem List Patient Active Problem List   Diagnosis Date Noted  .  Chest pain 09/12/2014   Cammie McgeeMichael C Pablo Stauffer, PT, DPT # [phone] 05/17/2018, 1:35 PM  [place] [facility] [address] [place], [place], [address] Phone: [phone]   Fax:  [phone]  Name: Ethan Rosales MRN: [id number] Date of Birth: 1960-08-03

## 2018-05-24 ENCOUNTER — Ambulatory Visit: Payer: Medicaid Other | Admitting: Physical Therapy

## 2018-05-24 DIAGNOSIS — G8929 Other chronic pain: Secondary | ICD-10-CM

## 2018-05-24 DIAGNOSIS — M25512 Pain in left shoulder: Principal | ICD-10-CM

## 2018-05-24 DIAGNOSIS — M25612 Stiffness of left shoulder, not elsewhere classified: Secondary | ICD-10-CM

## 2018-05-24 DIAGNOSIS — M6281 Muscle weakness (generalized): Secondary | ICD-10-CM

## 2018-05-27 NOTE — Therapy (Signed)
[facility] [address] [place], [place], [address] Phone: [phone]   Fax:  [phone]  Physical Therapy Treatment  Patient Details  Name: Ethan Rosales MRN: [id number] Date of Birth: 01-21-1961 Referring Provider (PT): Dr. Roney Mansreighton   Encounter Date: 05/24/2018  PT End of Session - 05/27/18 1807    Visit Number  3    Number of Visits  4    Date for PT Re-Evaluation  06/07/18    PT Start Time  0710    PT Stop Time  0815    PT Time Calculation (min)  65 min    Activity Tolerance  Patient limited by pain;Patient tolerated treatment well    Behavior During Therapy  [facility] for tasks assessed/performed       Past Medical History:  Diagnosis Date  . Acid reflux   . Arthritis    "probably in my knees" (09/12/2014)  . Chronic lower back pain   . Complication of anesthesia    "anesthesia burndt my skin in ~ 04/2014 when I had colonoscopy"  . GERD (gastroesophageal reflux disease)   . Heart murmur    "when I was little"  . High cholesterol   . Lumbar herniated disc    "L3, 4, 5" (09/12/2014)  . Myocardial infarction (HCC) 2003  . Pneumonia    "once when I was real little"  . Refusal of blood transfusions as patient is Jehovah's Witness   . Shortness of breath    ' AT TIMES "  . Stroke [facility](HCC) 2003   LEFT SIDE RESIDUAL (09/12/2014)  . Type II diabetes mellitus (HCC)     Past Surgical History:  Procedure Laterality Date  . CARDIAC CATHETERIZATION  02/25/2013  . FOOT SURGERY Bilateral 1999   "took bones out of my toes so I could walk"  . LEFT HEART CATHETERIZATION WITH CORONARY ANGIOGRAM N/A 02/25/2013   Procedure: LEFT HEART CATHETERIZATION WITH CORONARY ANGIOGRAM;  Surgeon: Robynn PaneMohan N Harwani, MD;  Location: [facility] CATH LAB;  Service: Cardiovascular;  Laterality: N/A;    There were no vitals filed for this visit.  Subjective Assessment - 05/27/18 1801    Subjective  Pt. states he has continued to have persistent L shoulder/  back pain.  Pt. entered PT with use of SPC and antalgic gait pattern.  Pt. states he is unable to afford paying for gym membership at this time.  Pt. is also concerned about losing CAID over next month.      Pertinent History  see recent PT notes/ medical charts.      Limitations  Lifting;Writing;House hold activities    Patient Stated Goals  Increase L shoulder ROM/ strength.      Currently in Pain?  Yes    Pain Score  8     Pain Location  Shoulder    Pain Orientation  Left    Pain Descriptors / Indicators  Aching    Pain Type  Chronic pain    Multiple Pain Sites  Yes    Pain Score  9    Pain Location  Back    Pain Orientation  Lower    Pain Descriptors / Indicators  Aching    Pain Type  Chronic pain          Treatment   Ther Ex:  B UBE 3 min. F/b. (warm-up)- discussed L sh. Pain.  Supine 5#: chest press/ tricep extension/ sh. Abduction/ adduction (light PT assist due to shoulder pain)/ serratus punches 20x  each.    Nautilus: 70# Lat pull downs(seated) 30x 60# Tricep Ext.(standing)30x 50# B sh. Adduction with handles (standing) 20x 50# Shoulderextension(standing)20x 70# Scap. Retraction (standing)  40# Bicep curls (standing)   Manual tx.:  Supine L shoulder AA/PROM: flexion/ ER/ abduction/ pec stretches with static holds  STM to L anterior deltoid/ lateral pectoralis muscles with palpation.  Supine L shoulder inferior/ AP mobs. 2x30 sec.       PT Long Term Goals - 05/11/18 0916      PT LONG TERM GOAL #1   Title  Pt. will increase FOTO from 50 to 57 to improve pain-free mobility.      Baseline  FOTO baseline: 50     Time  4    Period  Weeks    Status  New    Target Date  06/07/18      PT LONG TERM GOAL #2   Title  Pt. will increase L shoulder AROM to WNL as compared to R shoulder to improve pain-free mobility.      Baseline  Seated/ supine L shoulder AROM: flexion: 135/146 deg., abduction: 120/134 deg., ER: 54 deg., IR: 75 deg.     Time  4     Period  Weeks    Status  New    Target Date  06/07/18      PT LONG TERM GOAL #3   Title  Pt. will progress L shoulder strength to grossly 4+/5 MMT to improve carrying/ lifting tasks.     Baseline  Grip strength: R 132#, L 71#. L shoulder strength: flexion: 4/5 MMT, abduction: 4+/5 MMT. Pt. has continued L shoulder pain    Time  4    Period  Weeks    Status  New    Target Date  06/07/18      PT LONG TERM GOAL #4   Title  Pt. able to return to fishing with no L shoulder/elbow pain or limitations.      Baseline  pain limited (>7/10)    Time  4    Period  Weeks    Status  New    Target Date  06/07/18         Plan - 05/27/18 1808    Clinical Impression Statement  No improvement in L shoulder pain with manual tx.  Pt. remain pain focused/ limited with gentle stretches and manual tx. to L shoulder in supine position.  L lateral pec./ anterior deltoid tenderness with palpation and end-range horizontal abduction stretches.  PT focus on L shoulder strengthening in pain tolerable range with use of Nautilus/ 5# dumbbell.      Clinical Presentation  Evolving    Clinical Decision Making  Moderate    Rehab Potential  Fair    PT Frequency  1x / week    PT Duration  4 weeks    PT Treatment/Interventions  ADLs/Self Care Home Management;Moist Heat;Cryotherapy;Electrical Stimulation;Therapeutic activities;Functional mobility training;Therapeutic exercise;Neuromuscular re-education;Patient/family education;Manual techniques;Scar mobilization;Passive range of motion;Dry needling    PT Next Visit Plan  Progress L shoulder strengthening/ pain-free functional mobility.     PT Home Exercise Plan  see notes.    Consulted and Agree with Plan of Care  Patient       Patient will benefit from skilled therapeutic intervention in order to improve the following deficits and impairments:  Decreased mobility, Hypomobility, Decreased scar mobility, Decreased range of motion, Decreased activity tolerance, Decreased  strength, Pain, Postural dysfunction, Impaired UE functional use, Impaired flexibility  Visit Diagnosis:  Chronic left shoulder pain  Muscle weakness (generalized)  Shoulder joint stiffness, left     Problem List Patient Active Problem List   Diagnosis Date Noted  . Chest pain 09/12/2014   Cammie Mcgee, PT, DPT # [phone] 05/27/2018, 6:13 PM  [facility] [address] [place], [place], [address] Phone: [phone]   Fax:  [phone]  Name: Ethan Rosales MRN: [id number] Date of Birth: 10/30/1960

## 2018-05-31 ENCOUNTER — Ambulatory Visit: Payer: Medicaid Other | Admitting: Physical Therapy

## 2018-06-03 ENCOUNTER — Ambulatory Visit: Payer: Medicaid Other | Attending: Orthopedic Surgery | Admitting: Physical Therapy

## 2018-06-03 ENCOUNTER — Encounter: Payer: Self-pay | Admitting: Physical Therapy

## 2018-06-03 DIAGNOSIS — M6281 Muscle weakness (generalized): Secondary | ICD-10-CM | POA: Insufficient documentation

## 2018-06-03 DIAGNOSIS — G8929 Other chronic pain: Secondary | ICD-10-CM | POA: Insufficient documentation

## 2018-06-03 DIAGNOSIS — M25512 Pain in left shoulder: Secondary | ICD-10-CM | POA: Insufficient documentation

## 2018-06-03 DIAGNOSIS — M25612 Stiffness of left shoulder, not elsewhere classified: Secondary | ICD-10-CM | POA: Insufficient documentation

## 2018-06-03 NOTE — Therapy (Signed)
[facility] [facility] [address] [place], [place], [address] Phone: [phone]   Fax:  [phone]  Physical Therapy Treatment  Patient Details  Name: Rocklin Soderquist MRN: [id number] Date of Birth: 04/08/1961 Referring Provider (PT): Dr. Jeannie Fend   Encounter Date: 06/03/2018  PT End of Session - 06/03/18 0727    Visit Number  4    Number of Visits  7    Date for PT Re-Evaluation  07/01/18    PT Start Time  0725    PT Stop Time  0826    PT Time Calculation (min)  61 min    Activity Tolerance  Patient limited by pain;Patient tolerated treatment well    Behavior During Therapy  [facility] for tasks assessed/performed       Past Medical History:  Diagnosis Date  . Acid reflux   . Arthritis    "probably in my knees" (09/12/2014)  . Chronic lower back pain   . Complication of anesthesia    "anesthesia burndt my skin in ~ 04/2014 when I had colonoscopy"  . GERD (gastroesophageal reflux disease)   . Heart murmur    "when I was little"  . High cholesterol   . Lumbar herniated disc    "L3, 4, 5" (09/12/2014)  . Myocardial infarction ([place]) 2003  . Pneumonia    "once when I was real little"  . Refusal of blood transfusions as patient is Jehovah's Witness   . Shortness of breath    ' AT TIMES "  . Stroke [facility]) 2003   LEFT SIDE RESIDUAL (09/12/2014)  . Type II diabetes mellitus ([place])     Past Surgical History:  Procedure Laterality Date  . CARDIAC CATHETERIZATION  02/25/2013  . FOOT SURGERY Bilateral 1999   "took bones out of my toes so I could walk"  . LEFT HEART CATHETERIZATION WITH CORONARY ANGIOGRAM N/A 02/25/2013   Procedure: LEFT HEART CATHETERIZATION WITH CORONARY ANGIOGRAM;  Surgeon: Clent Demark, MD;  Location: [place] CATH LAB;  Service: Cardiovascular;  Laterality: N/A;    There were no vitals filed for this visit.  Subjective Assessment - 06/03/18 0726    Subjective  Pt. reports being sick over past several days but feeling a  little better today.  Pt. continues to report 7/10 L shoulder pain prior to PT tx. session.      Pertinent History  see recent PT notes/ medical charts.      Limitations  Lifting;Writing;House hold activities    Patient Stated Goals  Increase L shoulder ROM/ strength.      Currently in Pain?  Yes    Pain Score  7     Pain Location  Shoulder    Pain Orientation  Left    Pain Descriptors / Indicators  Aching    Pain Type  Chronic pain    Pain Score  9    Pain Location  Back    Pain Orientation  Lower    Pain Descriptors / Indicators  Aching    Pain Type  Chronic pain        There.ex.:  Standing B shoulder flexion/ abd./ IR 5x with holds (A/AROM) B UBE 3 min. F/b (no charge) Nautilus: seated B sh. Adduction 40#/ seated lat. Pull downs 70#/ standing tricep extension 50#/ diagonals 30#/ sh. Ext. (elbow extended) 30#/ scap. Retraction 60#/ sh. IR 20# 15x2 each.   Seated 6#: punches/ press-ups/ bicep curls 20x each.  Supine 6# serratus punches 20x each.  L  sh. AROM reassessment  Reviewed HEP  Manual tx.:  Supine/ seated L shoulder AA/PROM (all planes) STM to UT/ prox. Bicep/ deltoid musculature 6 min. Supine L shoulder inf./ AP grade II-III Mobs. 3x30 sec.  Supine L/R pec stretch with horizontal abduction (holds)     PT Long Term Goals - 06/03/18 1536      PT LONG TERM GOAL #1   Title  Pt. will increase FOTO from 50 to 57 to improve pain-free mobility.      Baseline  FOTO baseline: 50     Time  4    Period  Weeks    Status  On-going    Target Date  07/01/18      PT LONG TERM GOAL #2   Title  Pt. will increase L shoulder AROM to WNL as compared to R shoulder to improve pain-free mobility.      Baseline  Seated L shoulder AROM: flexion: 140 deg., abduction: 124 deg., ER: 58 deg., IR: 74 deg.    Time  4    Period  Weeks    Status  Partially Met    Target Date  07/01/18      PT LONG TERM GOAL #3   Title  Pt. will progress L shoulder strength to grossly 4+/5 MMT to  improve carrying/ lifting tasks.     Baseline  Grip strength: R 132#, L 71#. L shoulder strength: flexion: 4+/5 MMT (pain), abduction: 4+/5 MMT (pain).     Time  4    Period  Weeks    Status  Partially Met    Target Date  07/01/18      PT LONG TERM GOAL #4   Title  Pt. able to return to fishing with no L shoulder/elbow pain or limitations.      Baseline  pain limited (>7/10)    Time  4    Period  Weeks    Status  Not Met    Target Date  07/01/18         Plan - 06/03/18 0734    Clinical Impression Statement  Pt. continues to be limited by significant c/o L shoulder pain (7/10) and low back pain (9/10) at rest.  Seated L shoulder AROM: flexion: 140 deg., abduction: 124 deg., ER: 58 deg., IR: 74 deg.  L shoulder strength: flexion: 4+/5 MMT (pain), abduction: 4+/5 MMT (pain).  Pt. has progressed with increase resistance with UE ex. program.  (+) L pec./ anterior deltoid tenderness with palpation/ end-range supine horizontal abduction.  Pt. unable to sleep on L shoulder/ side secondary to discomfort.  Pt. has continued L shoulder pain with all aspects of overhead reaching/ strengthening ex. program.      Clinical Presentation  Evolving    Clinical Decision Making  Moderate    Rehab Potential  Fair    PT Frequency  1x / week    PT Duration  4 weeks    PT Treatment/Interventions  ADLs/Self Care Home Management;Moist Heat;Cryotherapy;Electrical Stimulation;Therapeutic activities;Functional mobility training;Therapeutic exercise;Neuromuscular re-education;Patient/family education;Manual techniques;Scar mobilization;Passive range of motion;Dry needling    PT Next Visit Plan  Progress L shoulder strengthening/ pain-free functional mobility.   3 more tx. sessions.      PT Home Exercise Plan  see notes.       Patient will benefit from skilled therapeutic intervention in order to improve the following deficits and impairments:  Decreased mobility, Hypomobility, Decreased scar mobility, Decreased  range of motion, Decreased activity tolerance, Decreased strength, Pain, Postural dysfunction,  Impaired UE functional use, Impaired flexibility  Visit Diagnosis: Chronic left shoulder pain  Muscle weakness (generalized)  Shoulder joint stiffness, left     Problem List Patient Active Problem List   Diagnosis Date Noted  . Chest pain 09/12/2014   Pura Spice, PT, DPT # [phone] 06/03/2018, 3:40 PM  [place] [facility] [address] [place], [place], [address] Phone: [phone]   Fax:  [phone]  Name: Devontay Celaya MRN: [id number] Date of Birth: February 17, 1961

## 2018-06-07 ENCOUNTER — Encounter: Payer: Self-pay | Admitting: Physical Therapy

## 2018-06-07 ENCOUNTER — Ambulatory Visit: Payer: Medicaid Other | Admitting: Physical Therapy

## 2018-06-07 DIAGNOSIS — M25512 Pain in left shoulder: Principal | ICD-10-CM

## 2018-06-07 DIAGNOSIS — G8929 Other chronic pain: Secondary | ICD-10-CM

## 2018-06-07 DIAGNOSIS — M6281 Muscle weakness (generalized): Secondary | ICD-10-CM

## 2018-06-07 DIAGNOSIS — M25612 Stiffness of left shoulder, not elsewhere classified: Secondary | ICD-10-CM

## 2018-06-07 NOTE — Therapy (Signed)
[place] [facility] [address] [place], [place], [address] Phone: [phone]   Fax:  [phone]  Physical Therapy Treatment  Patient Details  Name: Ethan Rosales MRN: [id number] Date of Birth: 1961/02/05 Referring Provider (PT): Dr. Jeannie Fend   Encounter Date: 06/07/2018    Treatment: 5 of 7.  Recert date: 06/28/9922 2683 to 0759   Past Medical History:  Diagnosis Date  . Acid reflux   . Arthritis    "probably in my knees" (09/12/2014)  . Chronic lower back pain   . Complication of anesthesia    "anesthesia burndt my skin in ~ 04/2014 when I had colonoscopy"  . GERD (gastroesophageal reflux disease)   . Heart murmur    "when I was little"  . High cholesterol   . Lumbar herniated disc    "L3, 4, 5" (09/12/2014)  . Myocardial infarction ([place]) 2003  . Pneumonia    "once when I was real little"  . Refusal of blood transfusions as patient is Jehovah's Witness   . Shortness of breath    ' AT TIMES "  . Stroke [facility]) 2003   LEFT SIDE RESIDUAL (09/12/2014)  . Type II diabetes mellitus ([place])     Past Surgical History:  Procedure Laterality Date  . CARDIAC CATHETERIZATION  02/25/2013  . FOOT SURGERY Bilateral 1999   "took bones out of my toes so I could walk"  . LEFT HEART CATHETERIZATION WITH CORONARY ANGIOGRAM N/A 02/25/2013   Procedure: LEFT HEART CATHETERIZATION WITH CORONARY ANGIOGRAM;  Surgeon: Clent Demark, MD;  Location: [place] CATH LAB;  Service: Cardiovascular;  Laterality: N/A;    There were no vitals filed for this visit.    Pt. reports no new complaints. L shoulder pain remains around a 7/10 with daily tasks.      There.ex.:  Nustep L6 B UE/LE (no charge).   Standing B shoulder flexion/ abd./ IR 5x with holds (A/AROM) Bodyblade: varying positions (30 sec. Each x 3)- mirror feedback Nautilus: seated B sh. Adduction 50#/ seated lat. Pull downs 70#/ standing tricep extension 50#/ diagonals 30#/ chest press 50#/  sh. Ext. (elbow extended) 50#/ scap. Retraction 60# 15x2 each.     Manual tx.:  STM to UT/ prox. Bicep/ deltoid musculature 10 min.     PT Long Term Goals - 06/03/18 1536      PT LONG TERM GOAL #1   Title  Pt. will increase FOTO from 50 to 57 to improve pain-free mobility.      Baseline  FOTO baseline: 50     Time  4    Period  Weeks    Status  On-going    Target Date  07/01/18      PT LONG TERM GOAL #2   Title  Pt. will increase L shoulder AROM to WNL as compared to R shoulder to improve pain-free mobility.      Baseline  Seated L shoulder AROM: flexion: 140 deg., abduction: 124 deg., ER: 58 deg., IR: 74 deg.    Time  4    Period  Weeks    Status  Partially Met    Target Date  07/01/18      PT LONG TERM GOAL #3   Title  Pt. will progress L shoulder strength to grossly 4+/5 MMT to improve carrying/ lifting tasks.     Baseline  Grip strength: R 132#, L 71#. L shoulder strength: flexion: 4+/5 MMT (pain), abduction: 4+/5 MMT (pain).     Time  4    Period  Weeks    Status  Partially Met    Target Date  07/01/18      PT LONG TERM GOAL #4   Title  Pt. able to return to fishing with no L shoulder/elbow pain or limitations.      Baseline  pain limited (>7/10)    Time  4    Period  Weeks    Status  Not Met    Target Date  07/01/18        Pt. demonstrates functional L shoulder AROM (flexion/ abduction) with standing tasks/ ther.ex. Pt. continues to report persistent shoulder/ back pain but no worsening with resisted ther.exKermit Balo technique with HEP and no changes at this time. No charge for tx. session at this time secondary to no CAID approval at this time.        Patient will benefit from skilled therapeutic intervention in order to improve the following deficits and impairments:  Decreased mobility, Hypomobility, Decreased scar mobility, Decreased range of motion, Decreased activity tolerance, Decreased strength, Pain, Postural dysfunction, Impaired UE functional use,  Impaired flexibility  Visit Diagnosis: Chronic left shoulder pain  Muscle weakness (generalized)  Shoulder joint stiffness, left     Problem List Patient Active Problem List   Diagnosis Date Noted  . Chest pain 09/12/2014   Pura Spice, PT, DPT # [phone] 06/11/2018, 8:29 AM  [place] [facility] [address] [place], [place], [address] Phone: [phone]   Fax:  [phone]  Name: Ethan Rosales MRN: [id number] Date of Birth: Jan 07, 1961

## 2018-06-14 ENCOUNTER — Encounter: Payer: Medicaid Other | Admitting: Physical Therapy

## 2018-06-15 ENCOUNTER — Ambulatory Visit: Payer: Medicaid Other | Admitting: Physical Therapy

## 2018-06-21 ENCOUNTER — Ambulatory Visit: Payer: Medicaid Other | Admitting: Physical Therapy

## 2018-06-21 ENCOUNTER — Encounter: Payer: Self-pay | Admitting: Physical Therapy

## 2018-06-21 DIAGNOSIS — M25512 Pain in left shoulder: Secondary | ICD-10-CM | POA: Diagnosis not present

## 2018-06-21 DIAGNOSIS — M25612 Stiffness of left shoulder, not elsewhere classified: Secondary | ICD-10-CM

## 2018-06-21 DIAGNOSIS — M6281 Muscle weakness (generalized): Secondary | ICD-10-CM

## 2018-06-21 DIAGNOSIS — G8929 Other chronic pain: Secondary | ICD-10-CM

## 2018-06-24 NOTE — Therapy (Signed)
[place] [facility] [address] [place], [place], [address] Phone: [phone]   Fax:  [phone]  Physical Therapy Treatment  Patient Details  Name: Ethan Rosales MRN: [id number] Date of Birth: [birth year] Referring Provider (PT): Dr. Jeannie Fend   Encounter Date: 06/21/2018  PT End of Session - 06/23/18 1453    Visit Number  6    Number of Visits  7    Date for PT Re-Evaluation  07/01/18    PT Start Time  0711    PT Stop Time  0809    PT Time Calculation (min)  58 min    Activity Tolerance  Patient limited by pain;Patient tolerated treatment well    Behavior During Therapy  [facility] for tasks assessed/performed       Past Medical History:  Diagnosis Date  . Acid reflux   . Arthritis    "probably in my knees" (09/12/2014)  . Chronic lower back pain   . Complication of anesthesia    "anesthesia burndt my skin in ~ 04/2014 when I had colonoscopy"  . GERD (gastroesophageal reflux disease)   . Heart murmur    "when I was little"  . High cholesterol   . Lumbar herniated disc    "L3, 4, 5" (09/12/2014)  . Myocardial infarction ([place]) 2003  . Pneumonia    "once when I was real little"  . Refusal of blood transfusions as patient is Jehovah's Witness   . Shortness of breath    ' AT TIMES "  . Stroke [facility]) 2003   LEFT SIDE RESIDUAL (09/12/2014)  . Type II diabetes mellitus ([place])     Past Surgical History:  Procedure Laterality Date  . CARDIAC CATHETERIZATION  02/25/2013  . FOOT SURGERY Bilateral 1999   "took bones out of my toes so I could walk"  . LEFT HEART CATHETERIZATION WITH CORONARY ANGIOGRAM N/A 02/25/2013   Procedure: LEFT HEART CATHETERIZATION WITH CORONARY ANGIOGRAM;  Surgeon: Clent Demark, MD;  Location: [place] CATH LAB;  Service: Cardiovascular;  Laterality: N/A;    There were no vitals filed for this visit.  Subjective Assessment - 06/23/18 1313    Subjective  Pt. states his L shoulder and back pain has remained  persistent.  Pt. scheduled to f/u with MD soon and discuss pain limitations.      Pertinent History  see recent PT notes/ medical charts.      Limitations  Lifting;Writing;House hold activities    Patient Stated Goals  Increase L shoulder ROM/ strength.      Currently in Pain?  Yes    Pain Score  7     Pain Location  Shoulder    Pain Orientation  Left    Pain Descriptors / Indicators  Aching    Pain Type  Chronic pain    Pain Onset  More than a month ago    Pain Frequency  Constant    Multiple Pain Sites  Yes    Pain Score  9    Pain Location  Back    Pain Orientation  Lower    Pain Descriptors / Indicators  Aching    Pain Type  Chronic pain    Pain Onset  More than a month ago          There.ex.:  Standing 6# bicep curls/ punches/ shoulder flexion 20x each (mirror feedback) Supine wt. Wand (4#): sh. Flexion/ abduction/ press-ups 20x each Nautilus: 50# tricep ext./ 40# sh. Adduction/ 70# lat. Pull downs/  70# scap. Retraction 30x each. Reviewed HEP  Seated B UBE 3 min. f/b      PT Long Term Goals - 06/24/18 0719      PT LONG TERM GOAL #1   Title  Pt. will increase FOTO from 50 to 57 to improve pain-free mobility.      Baseline  FOTO baseline: 50     Time  4    Period  Weeks    Status  Unable to assess    Target Date  06/21/18      PT LONG TERM GOAL #2   Title  Pt. will increase L shoulder AROM to WNL as compared to R shoulder to improve pain-free mobility.      Baseline  L shoulder AROM WFL but requires extra time with to obtain full flexion/ abduction in seated posture.     Time  4    Period  Weeks    Status  Achieved    Target Date  06/21/18      PT LONG TERM GOAL #3   Title  Pt. will progress L shoulder strength to grossly 4+/5 MMT to improve carrying/ lifting tasks.     Baseline  L shoulder strength: flexion: 4+/5 MMT (pain), abduction: 4+/5 MMT (pain).     Time  4    Period  Weeks    Status  Achieved    Target Date  06/21/18      PT LONG TERM GOAL #4    Title  Pt. able to return to fishing with no L shoulder/elbow pain or limitations.      Baseline  pain limited (>7/10)    Time  4    Period  Weeks    Status  Not Met    Target Date  06/21/18         Plan - 06/23/18 1454    Clinical Impression Statement  PT recommends discharge from PT at this time to focus on more independent/ gym based strengthening ex. program.  Pt. has shown good progress with L shoulder ROM/ stability but pain has remained persistent with no benefit from skilled PT manual tx./ modalities.  Pt. continues to have tenderness with palpation over L anterior deltoid/ pec musculature.  Pt. instructed to contact PT if any changes or questions with ex. program.      Clinical Presentation  Evolving    Clinical Decision Making  Moderate    Rehab Potential  Fair    PT Frequency  1x / week    PT Duration  4 weeks    PT Treatment/Interventions  ADLs/Self Care Home Management;Moist Heat;Cryotherapy;Electrical Stimulation;Therapeutic activities;Functional mobility training;Therapeutic exercise;Neuromuscular re-education;Patient/family education;Manual techniques;Scar mobilization;Passive range of motion;Dry needling    PT Next Visit Plan  Discharge       Patient will benefit from skilled therapeutic intervention in order to improve the following deficits and impairments:  Decreased mobility, Hypomobility, Decreased scar mobility, Decreased range of motion, Decreased activity tolerance, Decreased strength, Pain, Postural dysfunction, Impaired UE functional use, Impaired flexibility  Visit Diagnosis: Chronic left shoulder pain  Muscle weakness (generalized)  Shoulder joint stiffness, left     Problem List Patient Active Problem List   Diagnosis Date Noted  . Chest pain 09/12/2014   Pura Spice, PT, DPT # [phone] 06/24/2018, 7:46 AM  [place] [facility] [address] [place], [place], [address] Phone: [phone]   Fax:   [phone]  Name: Ethan Rosales MRN: [id number] Date of Birth: Dec 02, 1960

## 2019-01-14 ENCOUNTER — Observation Stay
Admission: EM | Admit: 2019-01-14 | Discharge: 2019-01-16 | Disposition: A | Discharge status: Home / Self Care | Payer: BLUE CROSS/BLUE SHIELD | Attending: Specialist | Admitting: Specialist

## 2019-01-14 DIAGNOSIS — I2510 Atherosclerotic heart disease of native coronary artery without angina pectoris: Secondary | ICD-10-CM | POA: Insufficient documentation

## 2019-01-14 DIAGNOSIS — Z79899 Other long term (current) drug therapy: Secondary | ICD-10-CM | POA: Diagnosis not present

## 2019-01-14 DIAGNOSIS — I129 Hypertensive chronic kidney disease with stage 1 through stage 4 chronic kidney disease, or unspecified chronic kidney disease: Secondary | ICD-10-CM | POA: Diagnosis not present

## 2019-01-14 DIAGNOSIS — H42 Glaucoma in diseases classified elsewhere: Secondary | ICD-10-CM | POA: Insufficient documentation

## 2019-01-14 DIAGNOSIS — Z880 Allergy status to penicillin: Secondary | ICD-10-CM | POA: Diagnosis not present

## 2019-01-14 DIAGNOSIS — N179 Acute kidney failure, unspecified: Secondary | ICD-10-CM | POA: Diagnosis not present

## 2019-01-14 DIAGNOSIS — N400 Enlarged prostate without lower urinary tract symptoms: Secondary | ICD-10-CM | POA: Diagnosis not present

## 2019-01-14 DIAGNOSIS — E876 Hypokalemia: Secondary | ICD-10-CM | POA: Diagnosis not present

## 2019-01-14 DIAGNOSIS — M1990 Unspecified osteoarthritis, unspecified site: Secondary | ICD-10-CM | POA: Insufficient documentation

## 2019-01-14 DIAGNOSIS — Z885 Allergy status to narcotic agent status: Secondary | ICD-10-CM | POA: Diagnosis not present

## 2019-01-14 DIAGNOSIS — I252 Old myocardial infarction: Secondary | ICD-10-CM | POA: Insufficient documentation

## 2019-01-14 DIAGNOSIS — N189 Chronic kidney disease, unspecified: Secondary | ICD-10-CM | POA: Insufficient documentation

## 2019-01-14 DIAGNOSIS — K219 Gastro-esophageal reflux disease without esophagitis: Secondary | ICD-10-CM | POA: Insufficient documentation

## 2019-01-14 DIAGNOSIS — E1139 Type 2 diabetes mellitus with other diabetic ophthalmic complication: Secondary | ICD-10-CM | POA: Diagnosis not present

## 2019-01-14 DIAGNOSIS — Z87891 Personal history of nicotine dependence: Secondary | ICD-10-CM | POA: Insufficient documentation

## 2019-01-14 DIAGNOSIS — E1122 Type 2 diabetes mellitus with diabetic chronic kidney disease: Secondary | ICD-10-CM | POA: Diagnosis not present

## 2019-01-14 DIAGNOSIS — Z8673 Personal history of transient ischemic attack (TIA), and cerebral infarction without residual deficits: Secondary | ICD-10-CM | POA: Insufficient documentation

## 2019-01-14 DIAGNOSIS — M25812 Other specified joint disorders, left shoulder: Secondary | ICD-10-CM | POA: Diagnosis not present

## 2019-01-14 DIAGNOSIS — M542 Cervicalgia: Secondary | ICD-10-CM | POA: Insufficient documentation

## 2019-01-14 DIAGNOSIS — H409 Unspecified glaucoma: Secondary | ICD-10-CM | POA: Insufficient documentation

## 2019-01-14 DIAGNOSIS — R0789 Other chest pain: Principal | ICD-10-CM | POA: Insufficient documentation

## 2019-01-14 DIAGNOSIS — Z20828 Contact with and (suspected) exposure to other viral communicable diseases: Secondary | ICD-10-CM | POA: Insufficient documentation

## 2019-01-14 DIAGNOSIS — E119 Type 2 diabetes mellitus without complications: Secondary | ICD-10-CM

## 2019-01-14 DIAGNOSIS — E7800 Pure hypercholesterolemia, unspecified: Secondary | ICD-10-CM | POA: Diagnosis not present

## 2019-01-14 NOTE — ED Triage Notes (Signed)
Pt to ED via EMS from home. Pt states chest pain started 3 hrs ago, pt has pain to left upper chest radiating to left arm and neck. Pt not moving left arm because of pain. Pt denies any trauma to area. Pain is worse when pt moves. Pt states frequent urination. cbg 125

## 2019-01-15 ENCOUNTER — Emergency Department: Payer: BLUE CROSS/BLUE SHIELD

## 2019-01-15 ENCOUNTER — Observation Stay: Payer: BLUE CROSS/BLUE SHIELD

## 2019-01-15 ENCOUNTER — Other Ambulatory Visit: Payer: Self-pay

## 2019-01-15 DIAGNOSIS — R0789 Other chest pain: Secondary | ICD-10-CM | POA: Diagnosis present

## 2019-01-15 LAB — CBC
HCT: 40.7 % (ref 39.0–52.0)
Hemoglobin: 14 g/dL (ref 13.0–17.0)
MCH: 28.6 pg (ref 26.0–34.0)
MCHC: 34.4 g/dL (ref 30.0–36.0)
MCV: 83.2 fL (ref 80.0–100.0)
Platelets: 239 10*3/uL (ref 150–400)
RBC: 4.89 MIL/uL (ref 4.22–5.81)
RDW: 12.9 % (ref 11.5–15.5)
WBC: 10.7 10*3/uL — ABNORMAL HIGH (ref 4.0–10.5)
nRBC: 0 % (ref 0.0–0.2)

## 2019-01-15 LAB — URINALYSIS, COMPLETE (UACMP) WITH MICROSCOPIC
Bacteria, UA: NONE SEEN
Bilirubin Urine: NEGATIVE
Glucose, UA: NEGATIVE mg/dL
Hgb urine dipstick: NEGATIVE
Ketones, ur: NEGATIVE mg/dL
Leukocytes,Ua: NEGATIVE
Nitrite: NEGATIVE
Protein, ur: NEGATIVE mg/dL
Specific Gravity, Urine: 1.005 (ref 1.005–1.030)
Squamous Epithelial / HPF: NONE SEEN (ref 0–5)
WBC, UA: NONE SEEN WBC/hpf (ref 0–5)
pH: 8 (ref 5.0–8.0)

## 2019-01-15 LAB — COMPREHENSIVE METABOLIC PANEL
ALT: 20 U/L (ref 0–44)
AST: 19 U/L (ref 15–41)
Albumin: 4.1 g/dL (ref 3.5–5.0)
Alkaline Phosphatase: 85 U/L (ref 38–126)
Anion gap: 9 (ref 5–15)
BUN: 10 mg/dL (ref 6–20)
CO2: 25 mmol/L (ref 22–32)
Calcium: 9.4 mg/dL (ref 8.9–10.3)
Chloride: 105 mmol/L (ref 98–111)
Creatinine, Ser: 1.42 mg/dL — ABNORMAL HIGH (ref 0.61–1.24)
GFR calc Af Amer: >60 mL/min (ref >60)
GFR calc non Af Amer: 55 mL/min — ABNORMAL LOW (ref >60)
Glucose, Bld: 126 mg/dL — ABNORMAL HIGH (ref 70–99)
Potassium: 3.1 mmol/L — ABNORMAL LOW (ref 3.5–5.1)
Sodium: 139 mmol/L (ref 135–145)
Total Bilirubin: 0.7 mg/dL (ref 0.3–1.2)
Total Protein: 7.5 g/dL (ref 6.5–8.1)

## 2019-01-15 LAB — GLUCOSE, CAPILLARY
Glucose-Capillary: 115 mg/dL — ABNORMAL HIGH (ref 70–99)
Glucose-Capillary: 128 mg/dL — ABNORMAL HIGH (ref 70–99)
Glucose-Capillary: 140 mg/dL — ABNORMAL HIGH (ref 70–99)
Glucose-Capillary: 153 mg/dL — ABNORMAL HIGH (ref 70–99)

## 2019-01-15 LAB — TROPONIN I (HIGH SENSITIVITY)
Troponin I (High Sensitivity): 7 ng/L (ref <18)
Troponin I (High Sensitivity): 9 ng/L (ref <18)

## 2019-01-15 LAB — HEMOGLOBIN A1C
Hgb A1c MFr Bld: 6.4 % — ABNORMAL HIGH (ref 4.8–5.6)
Mean Plasma Glucose: 136.98 mg/dL

## 2019-01-15 LAB — TSH: TSH: 2.28 u[IU]/mL (ref 0.350–4.500)

## 2019-01-15 MED ORDER — ROSUVASTATIN CALCIUM 10 MG PO TABS
10.0000 mg | ORAL_TABLET | Freq: Every day | ORAL | Status: DC
  Administered 2019-01-15 – 2019-01-16 (×2): 10 mg via ORAL
  Filled 2019-01-15 (×2): qty 1

## 2019-01-15 MED ORDER — TAMSULOSIN HCL 0.4 MG PO CAPS
0.4000 mg | ORAL_CAPSULE | Freq: Every day | ORAL | Status: DC
  Administered 2019-01-15 – 2019-01-16 (×2): 0.4 mg via ORAL
  Filled 2019-01-15 (×2): qty 1

## 2019-01-15 MED ORDER — PANTOPRAZOLE SODIUM 40 MG PO TBEC
40.0000 mg | DELAYED_RELEASE_TABLET | Freq: Every day | ORAL | Status: DC
  Administered 2019-01-15 – 2019-01-16 (×2): 40 mg via ORAL
  Filled 2019-01-15 (×2): qty 1

## 2019-01-15 MED ORDER — OXYCODONE-ACETAMINOPHEN 5-325 MG PO TABS
1.0000 | ORAL_TABLET | Freq: Four times a day (QID) | ORAL | Status: DC | PRN
  Administered 2019-01-15 – 2019-01-16 (×2): 1 via ORAL
  Filled 2019-01-15 (×2): qty 1

## 2019-01-15 MED ORDER — LOSARTAN POTASSIUM 50 MG PO TABS
100.0000 mg | ORAL_TABLET | Freq: Every day | ORAL | Status: DC
  Administered 2019-01-15 – 2019-01-16 (×2): 100 mg via ORAL
  Filled 2019-01-15 (×2): qty 2

## 2019-01-15 MED ORDER — IOHEXOL 350 MG/ML SOLN
125.0000 mL | Freq: Once | INTRAVENOUS | Status: AC | PRN
  Administered 2019-01-15: 125 mL via INTRAVENOUS

## 2019-01-15 MED ORDER — ACETAMINOPHEN 325 MG PO TABS: 650.0000 mg | ORAL_TABLET | Freq: Four times a day (QID) | ORAL | Status: DC | PRN

## 2019-01-15 MED ORDER — DOCUSATE SODIUM 100 MG PO CAPS
100.0000 mg | ORAL_CAPSULE | Freq: Two times a day (BID) | ORAL | Status: DC
  Administered 2019-01-15 – 2019-01-16 (×3): 100 mg via ORAL
  Filled 2019-01-15 (×3): qty 1

## 2019-01-15 MED ORDER — INSULIN ASPART 100 UNIT/ML ~~LOC~~ SOLN: 0.0000 [IU] | Freq: Every day | SUBCUTANEOUS | Status: DC

## 2019-01-15 MED ORDER — ONDANSETRON HCL 4 MG/2ML IJ SOLN
4.0000 mg | Freq: Four times a day (QID) | INTRAMUSCULAR | Status: DC | PRN
  Administered 2019-01-15: 4 mg via INTRAVENOUS
  Filled 2019-01-15: qty 2

## 2019-01-15 MED ORDER — TRIAMCINOLONE ACETONIDE 40 MG/ML IJ SUSP
80.0000 mg | Freq: Once | INTRAMUSCULAR | Status: AC
  Administered 2019-01-15: 80 mg via INTRA_ARTICULAR
  Filled 2019-01-15: qty 2

## 2019-01-15 MED ORDER — INSULIN ASPART 100 UNIT/ML ~~LOC~~ SOLN
0.0000 [IU] | Freq: Three times a day (TID) | SUBCUTANEOUS | Status: DC
  Administered 2019-01-15 – 2019-01-16 (×2): 1 [IU] via SUBCUTANEOUS
  Filled 2019-01-15 (×2): qty 1

## 2019-01-15 MED ORDER — POTASSIUM CHLORIDE CRYS ER 20 MEQ PO TBCR
40.0000 meq | EXTENDED_RELEASE_TABLET | Freq: Once | ORAL | Status: AC
  Administered 2019-01-15: 40 meq via ORAL
  Filled 2019-01-15: qty 2

## 2019-01-15 MED ORDER — LIDOCAINE HCL 0.5 % IJ SOLN
10.0000 mL | Freq: Once | INTRAMUSCULAR | Status: AC
  Administered 2019-01-15: 10 mL
  Filled 2019-01-15: qty 10

## 2019-01-15 MED ORDER — KETOROLAC TROMETHAMINE 30 MG/ML IJ SOLN
30.0000 mg | Freq: Four times a day (QID) | INTRAMUSCULAR | Status: DC | PRN
  Administered 2019-01-15 – 2019-01-16 (×3): 30 mg via INTRAVENOUS
  Filled 2019-01-15 (×3): qty 1

## 2019-01-15 MED ORDER — BUPIVACAINE HCL (PF) 0.5 % IJ SOLN
10.0000 mL | Freq: Once | INTRAMUSCULAR | Status: AC
  Administered 2019-01-15: 10 mL
  Filled 2019-01-15: qty 10

## 2019-01-15 MED ORDER — ONDANSETRON HCL 4 MG PO TABS: 4.0000 mg | ORAL_TABLET | Freq: Four times a day (QID) | ORAL | Status: DC | PRN

## 2019-01-15 MED ORDER — LATANOPROST 0.005 % OP SOLN
1.0000 [drp] | Freq: Every day | OPHTHALMIC | Status: DC
  Administered 2019-01-15: 1 [drp] via OPHTHALMIC
  Filled 2019-01-15: qty 2.5

## 2019-01-15 MED ORDER — ACETAMINOPHEN 650 MG RE SUPP: 650.0000 mg | Freq: Four times a day (QID) | RECTAL | Status: DC | PRN

## 2019-01-15 MED ORDER — ENOXAPARIN SODIUM 40 MG/0.4ML ~~LOC~~ SOLN
40.0000 mg | SUBCUTANEOUS | Status: DC
  Administered 2019-01-15: 21:00:00 40 mg via SUBCUTANEOUS
  Filled 2019-01-15: qty 0.4

## 2019-01-15 MED ORDER — GABAPENTIN 300 MG PO CAPS
300.0000 mg | ORAL_CAPSULE | Freq: Every day | ORAL | Status: DC
  Administered 2019-01-15: 300 mg via ORAL
  Filled 2019-01-15: qty 1

## 2019-01-15 MED ORDER — KETOROLAC TROMETHAMINE 30 MG/ML IJ SOLN
30.0000 mg | Freq: Once | INTRAMUSCULAR | Status: AC
  Administered 2019-01-15: 30 mg via INTRAVENOUS
  Filled 2019-01-15: qty 1

## 2019-01-15 MED ORDER — OXYCODONE-ACETAMINOPHEN 5-325 MG PO TABS
1.0000 | ORAL_TABLET | Freq: Once | ORAL | Status: AC
  Administered 2019-01-15: 1 via ORAL
  Filled 2019-01-15: qty 1

## 2019-01-15 NOTE — ED Notes (Signed)
Report to floor, to room 246

## 2019-01-15 NOTE — Progress Notes (Signed)
Arrived to room 246 via stretcher.  Pt settled in bed, assessed, and oriented to room.  Bed low, brakes locked, and call light within reach.

## 2019-01-15 NOTE — Consult Note (Signed)
ORTHOPAEDIC CONSULTATION  PATIENT NAME: Ethan Rosales DOB: 05-May-1960  MRN: [id number]  REQUESTING PHYSICIAN: Houston Siren, MD  Chief Complaint: Left shoulder pain  HPI: Ethan Rosales is a 58 y.o. male who complains of severe left shoulder pain especially with overhead activities.  Patient was going through a drive-through window when he had an abduction movement on his left shoulder.  He felt a sharp pain.  Patient has prior history of left shoulder arthroscopic surgery a year ago in [place].  He has prior history of left shoulder subacromial impingement for which he received left shoulder subacromial injection of Kenalog.  He denies any radiation of the pain down his arm.  He also has history of myocardial infarction and has been admitted to the hospitalist service with complaints of atypical chest pain.  Patient has been on disability for the last 3 years after surgery for his left sided sacroiliac joint fusion.  Past Medical History:  Diagnosis Date  . Acid reflux   . Arthritis    "probably in my knees" (09/12/2014)  . Chronic lower back pain   . Complication of anesthesia    "anesthesia burndt my skin in ~ 04/2014 when I had colonoscopy"  . GERD (gastroesophageal reflux disease)   . Heart murmur    "when I was little"  . High cholesterol   . Lumbar herniated disc    "L3, 4, 5" (09/12/2014)  . Myocardial infarction (HCC) 2003  . Pneumonia    "once when I was real little"  . Refusal of blood transfusions as patient is Jehovah's Witness   . Shortness of breath    ' AT TIMES "  . Stroke [facility]) 2003   LEFT SIDE RESIDUAL (09/12/2014)  . Type II diabetes mellitus (HCC)    Past Surgical History:  Procedure Laterality Date  . CARDIAC CATHETERIZATION  02/25/2013  . FOOT SURGERY Bilateral 1999   "took bones out of my toes so I could walk"  . LEFT HEART CATHETERIZATION WITH CORONARY ANGIOGRAM N/A 02/25/2013   Procedure: LEFT HEART CATHETERIZATION WITH CORONARY ANGIOGRAM;   Surgeon: Robynn Pane, MD;  Location: [facility] CATH LAB;  Service: Cardiovascular;  Laterality: N/A;   Social History   Socioeconomic History  . Marital status: Divorced    Spouse name: Not on file  . Number of children: Not on file  . Years of education: Not on file  . Highest education level: Not on file  Occupational History  . Not on file  Social Needs  . Financial resource strain: Not on file  . Food insecurity    Worry: Not on file    Inability: Not on file  . Transportation needs    Medical: Not on file    Non-medical: Not on file  Tobacco Use  . Smoking status: Former Smoker    Packs/day: 1.00    Years: 6.00    Pack years: 6.00    Types: Cigarettes  . Smokeless tobacco: Never Used  . Tobacco comment: "stopped smoking in the 1990's"  Substance and Sexual Activity  . Alcohol use: Yes    Comment: "stopped drinking in the 1990's"  . Drug use: No  . Sexual activity: Not Currently  Lifestyle  . Physical activity    Days per week: Not on file    Minutes per session: Not on file  . Stress: Not on file  Relationships  . Social Musician on phone: Not on file    Gets together: Not on  file    Attends religious service: Not on file    Active member of club or organization: Not on file    Attends meetings of clubs or organizations: Not on file    Relationship status: Not on file  Other Topics Concern  . Not on file  Social History Narrative   ** Merged History Encounter **       No family history on file. Allergies  Allergen Reactions  . Hydromorphone Anaphylaxis    Other reaction(s): ANAPHYLAXIS  . Morphine And Related Other (See Comments)    Syncope and stopped breathing  . Tamiflu [Oseltamivir] Palpitations  . Codeine Palpitations and Other (See Comments)    syncope  . Penicillins Hives and Rash   Prior to Admission medications   Medication Sig Start Date End Date Taking? Authorizing Provider  gabapentin (NEURONTIN) 300 MG capsule Take 300 mg by  mouth at bedtime. 03/04/17  Yes [provider]  losartan (COZAAR) 100 MG tablet Take 100 mg by mouth daily. 11/09/18  Yes [provider]  omeprazole (PRILOSEC) 20 MG capsule Take 20 mg by mouth daily.   Yes [provider]  OZEMPIC, 0.25 OR 0.5 MG/DOSE, 2 MG/1.5ML SOPN Inject 0.5 mg into the skin every Friday. 01/06/19  Yes [provider]  rosuvastatin (CRESTOR) 5 MG tablet Take 10 mg by mouth daily.    Yes [provider]  tamsulosin (FLOMAX) 0.4 MG CAPS capsule Take 0.4 mg by mouth daily. 01/06/19  Yes [provider]  Travoprost, BAK Free, (TRAVATAN) 0.004 % SOLN ophthalmic solution Place 1 drop into both eyes at bedtime. 11/16/13  Yes [provider]   Dg Cervical Spine 2 Or 3 Views  Result Date: 01/15/2019 CLINICAL DATA:  Atypical chest pain. Pain radiates into the left arm and neck EXAM: CERVICAL SPINE - 2-3 VIEW COMPARISON:  None. FINDINGS: There is no evidence of cervical spine fracture or prevertebral soft tissue swelling. Alignment is normal. No other significant bone abnormalities are identified. IMPRESSION: Negative cervical spine radiographs. Electronically Signed   By: Kathreen Devoid   On: 01/15/2019 12:43   Dg Chest Port 1 View  Result Date: 01/15/2019 CLINICAL DATA:  Chest pain EXAM: PORTABLE CHEST 1 VIEW COMPARISON:  09/12/2014 FINDINGS: Streaky atelectasis left lung base. No pleural effusion or pneumothorax. Cardiomediastinal silhouette within normal limits. IMPRESSION: Streaky atelectasis left lung base.  Otherwise negative. Electronically Signed   By: Donavan Foil M.D.   On: 01/15/2019 00:53   Dg Shoulder Left  Result Date: 01/15/2019 CLINICAL DATA:  Chest pain radiating to the left shoulder and arm. EXAM: LEFT SHOULDER - 2+ VIEW COMPARISON:  None. FINDINGS: There is no evidence of fracture or dislocation. There is no evidence of arthropathy or other focal bone abnormality. Soft tissues are unremarkable. IMPRESSION:  Negative. Electronically Signed   By: Titus Dubin M.D.   On: 01/15/2019 12:42   Ct Angio Chest Aorta W And/or Wo Contrast  Result Date: 01/15/2019 CLINICAL DATA:  Complex chest pain radiating to left arm. Cold left hand. EXAM: CT ANGIOGRAPHY CHEST WITH CONTRAST TECHNIQUE: Multidetector CT imaging of the chest was performed using the standard protocol during bolus administration of intravenous contrast. Multiplanar CT image reconstructions and MIPs were obtained to evaluate the vascular anatomy. CONTRAST:  163mL OMNIPAQUE IOHEXOL 350 MG/ML SOLN COMPARISON:  None FINDINGS: Cardiovascular: There is no dissection. No large centrally located pulmonary embolus. The visualized portions of the left subclavian artery and left axillary artery are unremarkable. The right subclavian  artery is not well evaluated secondary to extensive streak artifact. Heart size is essentially normal. There is no significant pericardial effusion Mediastinum/Nodes: --No mediastinal or hilar lymphadenopathy. --No axillary lymphadenopathy. --No supraclavicular lymphadenopathy. --Normal thyroid gland. --The esophagus is unremarkable Lungs/Pleura: No pulmonary nodules or masses. No pleural effusion or pneumothorax. No focal airspace consolidation. No focal pleural abnormality. Upper Abdomen: No acute abnormality. Musculoskeletal: No chest wall abnormality. No acute or significant osseous findings. Review of the MIP images confirms the above findings. IMPRESSION: No acute intrathoracic process to explain the patient's symptoms. Electronically Signed   By: Katherine Mantlehristopher  Green M.D.   On: 01/15/2019 02:28    Positive ROS: All other systems have been reviewed and were otherwise negative with the exception of those mentioned in the HPI and as above.  Physical Exam: General: Well developed, well nourished male seen in no acute distress. HEENT: Atraumatic and normocephalic. Sclera are clear. Extraocular motion is intact. Oropharynx is clear  with moist mucosa. Neck: Supple, nontender, good range of motion. No JVD or carotid bruits. Lungs: Clear to auscultation bilaterally. Cardiovascular: Regular rate and rhythm with normal S1 and S2. No murmurs. No gallops or rubs. Pedal pulses are palpable bilaterally. Homans test is negative bilaterally. No significant pretibial or ankle edema. Abdomen: Soft, nontender, and nondistended. Bowel sounds are present. Skin: No lesions in the area of chief complaint Neurologic: Awake, alert, and oriented. Sensory function is grossly intact. Motor strength is felt to be 5 over 5 bilaterally. No clonus or tremor. Good motor coordination. Lymphatic: No axillary or cervical lymphadenopathy  MUSCULOSKELETAL: Left shoulder exam shows no obvious swelling.  He has tenderness to palpation anteriorly as well as posteriorly on his shoulder.  He has active shoulder forward flexion up to 90 and abduction up to 60 degrees and internal rotation to his lower lumbar spine.  He has positive Neer and Hawkins impingement test.  Rotator cuff muscle strength testing is not possible due to pain.  Assessment: 58 years old male with left shoulder subacromial impingement  Plan: 58 years old male with symptoms suggestive of left shoulder subacromial impingement.  Patient has prior history of left shoulder arthroscopic surgery.  X-rays done today does not show any evidence of cuff tear arthropathy.  At this point I would recommend oral anti-inflammatory medication as well as left shoulder subacromial injection.  Patient tolerated the left shoulder subacromial injection of cortisone.  He is also recommended to be on oral anti-inflammatory medication like ibuprofen or naproxen for the next 2 weeks.  Patient [place] [facility] [place] [facility] [place]. Garnette GunnerShakeel Amazing Cowman, M.D.

## 2019-01-15 NOTE — H&P (Addendum)
Ethan Rosales is an 58 y.o. male.   Chief Complaint: Chest pain HPI: Patient with past medical history of myocardial infarction and stroke (both in 2003), chronic back pain status post SI fusion, acid reflux obesity and diabetes presents to the emergency department complaining of chest pain.  The patient was at rest when his chest began to hurt.  He was reaching out of the window at a drive-through when pain originated in the left side of his chest and radiated into his neck and shoulder.  The patient admits to feeling lightheaded and short of breath.  He denies nausea, vomiting or diaphoresis.  He also denies palpitations.  Cardiac biomarkers, EKG and chest x-ray unremarkable.  Heart rate and blood pressure also controlled.  The patient continued to have chest pain.  Due to his clinical appearance as well as his past medical history the emergency department staff asked the hospitalist service for further evaluation.  Past Medical History:  Diagnosis Date  . Acid reflux   . Arthritis    "probably in my knees" (09/12/2014)  . Chronic lower back pain   . Complication of anesthesia    "anesthesia burndt my skin in ~ 04/2014 when I had colonoscopy"  . GERD (gastroesophageal reflux disease)   . Heart murmur    "when I was little"  . High cholesterol   . Lumbar herniated disc    "L3, 4, 5" (09/12/2014)  . Myocardial infarction ([place]) 2003  . Pneumonia    "once when I was real little"  . Refusal of blood transfusions as patient is Jehovah's Witness   . Shortness of breath    ' AT TIMES "  . Stroke [facility]) 2003   LEFT SIDE RESIDUAL (09/12/2014)  . Type II diabetes mellitus ([place])     Past Surgical History:  Procedure Laterality Date  . CARDIAC CATHETERIZATION  02/25/2013  . FOOT SURGERY Bilateral 1999   "took bones out of my toes so I could walk"  . LEFT HEART CATHETERIZATION WITH CORONARY ANGIOGRAM N/A 02/25/2013   Procedure: LEFT HEART CATHETERIZATION WITH CORONARY ANGIOGRAM;  Surgeon: Clent Demark, MD;  Location: [place] CATH LAB;  Service: Cardiovascular;  Laterality: N/A;    No family history on file. Social History:  reports that he has quit smoking. His smoking use included cigarettes. He has a 6.00 pack-year smoking history. He has never used smokeless tobacco. He reports current alcohol use. He reports that he does not use drugs.  Allergies:  Allergies  Allergen Reactions  . Hydromorphone Anaphylaxis    Other reaction(s): ANAPHYLAXIS  . Morphine And Related Other (See Comments)    Syncope and stopped breathing  . Tamiflu [Oseltamivir] Palpitations  . Codeine Palpitations and Other (See Comments)    syncope  . Penicillins Hives and Rash    Prior to Admission medications   Medication Sig Start Date End Date Taking? Authorizing Provider  gabapentin (NEURONTIN) 300 MG capsule Take 300 mg by mouth at bedtime. 03/04/17  Yes [provider]  losartan (COZAAR) 100 MG tablet Take 100 mg by mouth daily. 11/09/18  Yes [provider]  omeprazole (PRILOSEC) 20 MG capsule Take 20 mg by mouth daily.   Yes [provider]  OZEMPIC, 0.25 OR 0.5 MG/DOSE, 2 MG/1.5ML SOPN Inject 0.5 mg into the skin every Friday. 01/06/19  Yes [provider]  rosuvastatin (CRESTOR) 5 MG tablet Take 10 mg by mouth daily.    Yes [provider]  tamsulosin (FLOMAX) 0.4 MG CAPS capsule  Take 0.4 mg by mouth daily. 01/06/19  Yes [provider]  Travoprost, BAK Free, (TRAVATAN) 0.004 % SOLN ophthalmic solution Place 1 drop into both eyes at bedtime. 11/16/13  Yes [provider]     Results for orders placed or performed during the hospital encounter of 01/14/19 (from the past 48 hour(s))  Urinalysis, Complete w Microscopic     Status: Abnormal   Collection Time: 01/15/19 12:09 AM  Result Value Ref Range   Color, Urine STRAW (A) YELLOW   APPearance CLEAR (A) CLEAR   Specific Gravity, Urine 1.005 1.005 - 1.030   pH 8.0 5.0 - 8.0   Glucose, UA  NEGATIVE NEGATIVE mg/dL   Hgb urine dipstick NEGATIVE NEGATIVE   Bilirubin Urine NEGATIVE NEGATIVE   Ketones, ur NEGATIVE NEGATIVE mg/dL   Protein, ur NEGATIVE NEGATIVE mg/dL   Nitrite NEGATIVE NEGATIVE   Leukocytes,Ua NEGATIVE NEGATIVE   WBC, UA NONE SEEN 0 - 5 WBC/hpf   Bacteria, UA NONE SEEN NONE SEEN   Squamous Epithelial / LPF NONE SEEN 0 - 5    Comment: Performed at [facility], [address] [place], [place] [address]  CBC     Status: Abnormal   Collection Time: 01/15/19 12:10 AM  Result Value Ref Range   WBC 10.7 (H) 4.0 - 10.5 K/uL   RBC 4.89 4.22 - 5.81 MIL/uL   Hemoglobin 14.0 13.0 - 17.0 g/dL   HCT 60.440.7 54.039.0 - 98.152.0 %   MCV 83.2 80.0 - 100.0 fL   MCH 28.6 26.0 - 34.0 pg   MCHC 34.4 30.0 - 36.0 g/dL   RDW 19.112.9 47.811.5 - 29.515.5 %   Platelets 239 150 - 400 K/uL   nRBC 0.0 0.0 - 0.2 %    Comment: Performed at [facility], [address] [place], [place] [address]  Comprehensive metabolic panel     Status: Abnormal   Collection Time: 01/15/19 12:10 AM  Result Value Ref Range   Sodium 139 135 - 145 mmol/L   Potassium 3.1 (L) 3.5 - 5.1 mmol/L   Chloride 105 98 - 111 mmol/L   CO2 25 22 - 32 mmol/L   Glucose, Bld 126 (H) 70 - 99 mg/dL   BUN 10 6 - 20 mg/dL   Creatinine, Ser 8.651.42 (H) 0.61 - 1.24 mg/dL   Calcium 9.4 8.9 - 78.410.3 mg/dL   Total Protein 7.5 6.5 - 8.1 g/dL   Albumin 4.1 3.5 - 5.0 g/dL   AST 19 15 - 41 U/L   ALT 20 0 - 44 U/L   Alkaline Phosphatase 85 38 - 126 U/L   Total Bilirubin 0.7 0.3 - 1.2 mg/dL   GFR calc non Af Amer 55 (L) >60 mL/min   GFR calc Af Amer >60 >60 mL/min   Anion gap 9 5 - 15    Comment: Performed at [facility], [address] [place], [place] [address]  Troponin I (High Sensitivity)     Status: None   Collection Time: 01/15/19 12:10 AM  Result Value Ref Range   Troponin I (High Sensitivity) 9 <18 ng/L    Comment: (NOTE) Elevated high sensitivity troponin I (hsTnI) values and significant  changes across  serial measurements may suggest ACS but many other  chronic and acute conditions are known to elevate hsTnI results.  Refer to the "Links" section for chest pain algorithms and additional  guidance. Performed at [facility], [address] [place], [place] [address]   Troponin  I (High Sensitivity)     Status: None   Collection Time: 01/15/19  2:08 AM  Result Value Ref Range   Troponin I (High Sensitivity) 7 <18 ng/L    Comment: (NOTE) Elevated high sensitivity troponin I (hsTnI) values and significant  changes across serial measurements may suggest ACS but many other  chronic and acute conditions are known to elevate hsTnI results.  Refer to the "Links" section for chest pain algorithms and additional  guidance. Performed at [facility], [address] [place], [place] [address]    Dg Chest Port 1 View  Result Date: 01/15/2019 CLINICAL DATA:  Chest pain EXAM: PORTABLE CHEST 1 VIEW COMPARISON:  09/12/2014 FINDINGS: Streaky atelectasis left lung base. No pleural effusion or pneumothorax. Cardiomediastinal silhouette within normal limits. IMPRESSION: Streaky atelectasis left lung base.  Otherwise negative. Electronically Signed   By: Jasmine PangKim  Fujinaga M.D.   On: 01/15/2019 00:53   Ct Angio Chest Aorta W And/or Wo Contrast  Result Date: 01/15/2019 CLINICAL DATA:  Complex chest pain radiating to left arm. Cold left hand. EXAM: CT ANGIOGRAPHY CHEST WITH CONTRAST TECHNIQUE: Multidetector CT imaging of the chest was performed using the standard protocol during bolus administration of intravenous contrast. Multiplanar CT image reconstructions and MIPs were obtained to evaluate the vascular anatomy. CONTRAST:  125mL OMNIPAQUE IOHEXOL 350 MG/ML SOLN COMPARISON:  None FINDINGS: Cardiovascular: There is no dissection. No large centrally located pulmonary embolus. The visualized portions of the left subclavian artery and left axillary artery are unremarkable. The right subclavian  artery is not well evaluated secondary to extensive streak artifact. Heart size is essentially normal. There is no significant pericardial effusion Mediastinum/Nodes: --No mediastinal or hilar lymphadenopathy. --No axillary lymphadenopathy. --No supraclavicular lymphadenopathy. --Normal thyroid gland. --The esophagus is unremarkable Lungs/Pleura: No pulmonary nodules or masses. No pleural effusion or pneumothorax. No focal airspace consolidation. No focal pleural abnormality. Upper Abdomen: No acute abnormality. Musculoskeletal: No chest wall abnormality. No acute or significant osseous findings. Review of the MIP images confirms the above findings. IMPRESSION: No acute intrathoracic process to explain the patient's symptoms. Electronically Signed   By: Katherine Mantlehristopher  Green M.D.   On: 01/15/2019 02:28    Review of Systems  Constitutional: Negative for chills and fever.  HENT: Negative for sore throat and tinnitus.   Eyes: Negative for blurred vision and redness.  Respiratory: Negative for cough and shortness of breath.   Cardiovascular: Positive for chest pain. Negative for palpitations, orthopnea and PND.  Gastrointestinal: Negative for abdominal pain, diarrhea, nausea and vomiting.  Genitourinary: Negative for dysuria, frequency and urgency.  Musculoskeletal: Negative for joint pain and myalgias.  Skin: Negative for rash.       No lesions  Neurological: Negative for speech change, focal weakness and weakness.  Endo/Heme/Allergies: Does not bruise/bleed easily.       No temperature intolerance  Psychiatric/Behavioral: Negative for depression and suicidal ideas.    Blood pressure (!) 150/88, pulse 71, temperature 97.9 F (36.6 C), temperature source Oral, resp. rate 16, height 6\' 3"  (1.905 m), weight 129.3 kg, SpO2 99 %. Physical Exam  Vitals reviewed. Constitutional: He is oriented to person, place, and time. He appears well-developed and well-nourished. No distress.  HENT:  Head:  Normocephalic and atraumatic.  Mouth/Throat: Oropharynx is clear and moist.  Eyes: Pupils are equal, round, and reactive to light. EOM are normal. Right conjunctiva is injected. Left conjunctiva is injected. No scleral icterus.  Neck: Normal range of motion. Neck supple. No JVD present. No tracheal deviation  present. No thyromegaly present.  Cardiovascular: Normal rate, regular rhythm and normal heart sounds. Exam reveals no gallop and no friction rub.  No murmur heard. Respiratory: Breath sounds normal. No respiratory distress. He has no wheezes.  GI: Soft. Bowel sounds are normal. He exhibits no distension.  Genitourinary:    Genitourinary Comments: Deferred   Musculoskeletal: Normal range of motion.        General: No edema.  Lymphadenopathy:    He has no cervical adenopathy.  Neurological: He is alert and oriented to person, place, and time. No cranial nerve deficit.  Skin: Skin is warm and dry. No rash noted. No erythema.  Psychiatric: He has a normal mood and affect. His behavior is normal. Judgment and thought content normal.     Assessment/Plan This is a 58 year old male admitted for chest pain. 1.  Chest pain: Atypical; likely noncardiac.  Pain is reproducible over left pectoralis and left shoulder.  Monitor telemetry.  Troponins have been negative and EKG is normal sinus rhythm without evidence of ischemia.  Nuclear stress test from June 2019 shows no significant calcifications and small, mild reversible defects on perfusion.  LVEF estimated at 64%.  Thus, I believe his pain is related to his rotator cuff repair.  Cardiology consultation at discretion of primary team. 2.  Coronary artery disease: Stable; continue secondary prevention.  (The patient is not on aspirin and it is not clear why). 3.  Hypertension: Intermittently controlled.  Continue losartan.  Start amlodipine 5 mg 4.  Acute kidney injury: Superimposed on chronic kidney disease.  Gentle IV hydration.  Avoid nephrotoxic  agents. 5.  Diabetes mellitus type 2: Sliding scale insulin while hospitalized 6.  Hyperlipidemia: Continue statin therapy 7.  BPH: Continue tamsulosin 8.  Increased intraocular pressure: No vision changes associated with chest pain.  Denies headache.  Continue latanoprost 9.  DVT: Lovenox 10.  GI prophylaxis: Pantoprazole per home regimen The patient is a full code.  Time spent on admission orders and patient care approximately 45 minutes  Arnaldo Natal, MD 01/15/2019, 6:39 AM

## 2019-01-15 NOTE — ED Provider Notes (Signed)
[facility] Emergency Department Provider Note   ____________________________________________    I have reviewed the triage vital signs and the nursing notes.   HISTORY  Chief Complaint Chest Pain     HPI Ethan Rosales is a 58 y.o. male with a history as described below who presents with complaints of left-sided anterior chest and shoulder and arm pain.  Patient describes pain started when he was reaching for a drink in a drive-through window.  He reports the pain is severe and worse with any movement of his left arm.  Denies pleurisy.  No fevers or chills, no cough or shortness of breath.  Never had this before.  Is not take anything for this.  Symptoms started about 3 hours prior to arrival.  Past Medical History:  Diagnosis Date   Acid reflux    Arthritis    "probably in my knees" (09/12/2014)   Chronic lower back pain    Complication of anesthesia    "anesthesia burndt my skin in ~ 04/2014 when I had colonoscopy"   GERD (gastroesophageal reflux disease)    Heart murmur    "when I was little"   High cholesterol    Lumbar herniated disc    "L3, 4, 5" (09/12/2014)   Myocardial infarction [facility]) 2003   Pneumonia    "once when I was real little"   Refusal of blood transfusions as patient is Jehovah's Witness    Shortness of breath    ' AT TIMES "   Stroke ([place]) 2003   LEFT SIDE RESIDUAL (09/12/2014)   Type II diabetes mellitus ([place])     Patient Active Problem List   Diagnosis Date Noted   Atypical chest pain 01/15/2019   Chest pain 09/12/2014    Past Surgical History:  Procedure Laterality Date   CARDIAC CATHETERIZATION  02/25/2013   FOOT SURGERY Bilateral 1999   "took bones out of my toes so I could walk"   LEFT HEART CATHETERIZATION WITH CORONARY ANGIOGRAM N/A 02/25/2013   Procedure: LEFT HEART CATHETERIZATION WITH CORONARY ANGIOGRAM;  Surgeon: Clent Demark, MD;  Location: [facility] CATH [facility];  Service: Cardiovascular;   Laterality: N/A;    Prior to Admission medications   Medication Sig Start Date End Date Taking? Authorizing Provider  gabapentin (NEURONTIN) 300 MG capsule Take 300 mg by mouth at bedtime. 03/04/17  Yes [provider]  losartan (COZAAR) 100 MG tablet Take 100 mg by mouth daily. 11/09/18  Yes [provider]  omeprazole (PRILOSEC) 20 MG capsule Take 20 mg by mouth daily.   Yes [provider]  OZEMPIC, 0.25 OR 0.5 MG/DOSE, 2 MG/1.5ML SOPN Inject 0.5 mg into the skin every Friday. 01/06/19  Yes [provider]  rosuvastatin (CRESTOR) 5 MG tablet Take 10 mg by mouth daily.    Yes [provider]  tamsulosin (FLOMAX) 0.4 MG CAPS capsule Take 0.4 mg by mouth daily. 01/06/19  Yes [provider]  Travoprost, BAK Free, (TRAVATAN) 0.004 % SOLN ophthalmic solution Place 1 drop into both eyes at bedtime. 11/16/13  Yes [provider]     Allergies Hydromorphone, Morphine and related, Tamiflu [oseltamivir], Codeine, and Penicillins  No family history on file.  Social History Social History   Tobacco Use   Smoking status: Former Smoker    Packs/day: 1.00    Years: 6.00    Pack years: 6.00    Types: Cigarettes   Smokeless tobacco: Never Used   Tobacco comment: "stopped smoking in the  1990's"  Substance Use Topics   Alcohol use: Yes    Comment: "stopped drinking in the 1990's"   Drug use: No    Review of Systems  Constitutional: No fever/chills Eyes: No visual changes.  ENT: No neck pain Cardiovascular: As above Respiratory: Denies shortness of breath. Gastrointestinal: No abdominal pain.  No nausea, no vomiting.   Genitourinary: Negative for dysuria. Musculoskeletal: As above Skin: Negative for rash. Neurological: Negative for headaches or weakness   ____________________________________________   PHYSICAL EXAM:  VITAL SIGNS: ED Triage Vitals  Enc Vitals Group     BP 01/15/19 0051 (!) 142/87     Pulse Rate  01/15/19 0006 87     Resp 01/15/19 0006 20     Temp 01/15/19 0006 97.9 F (36.6 C)     Temp Source 01/15/19 0006 Oral     SpO2 01/15/19 0006 99 %     Weight 01/15/19 0000 129.3 kg (285 lb)     Height 01/15/19 0000 1.905 m (6\' 3" )     Head Circumference --      Peak Flow --      Pain Score 01/15/19 0000 9     Pain Loc --      Pain Edu? --      Excl. in GC? --     Constitutional: Alert and oriented.   Nose: No congestion/rhinnorhea. Mouth/Throat: Mucous membranes are moist.   Neck:  Painless ROM, no vertebral tenderness palpation no bruit Cardiovascular: Normal rate, regular rhythm. Grossly normal heart sounds.  Good peripheral circulation. Respiratory: Normal respiratory effort.  No retractions. Lungs CTAB. Gastrointestinal: Soft and nontender. No distention.  .  Musculoskeletal: Patient has significant pain with any movement of his left arm, normal pulses, warm and well-perfused, tenderness along the left pectoralis muscle Neurologic:  Normal speech and language. No gross focal neurologic deficits are appreciated.  Skin:  Skin is warm, dry and intact. No rash noted. Psychiatric: Mood and affect are normal. Speech and behavior are normal.  ____________________________________________   LABS (all labs ordered are listed, but only abnormal results are displayed)  Labs Reviewed  CBC - Abnormal; Notable for the following components:      Result Value   WBC 10.7 (*)    All other components within normal limits  COMPREHENSIVE METABOLIC PANEL - Abnormal; Notable for the following components:   Potassium 3.1 (*)    Glucose, Bld 126 (*)    Creatinine, Ser 1.42 (*)    GFR calc non Af Amer 55 (*)    All other components within normal limits  URINALYSIS, COMPLETE (UACMP) WITH MICROSCOPIC - Abnormal; Notable for the following components:   Color, Urine STRAW (*)    APPearance CLEAR (*)    All other components within normal limits  NOVEL CORONAVIRUS, NAA (HOSP ORDER, SEND-OUT TO  REF LAB; TAT 18-24 HRS)  TROPONIN I (HIGH SENSITIVITY)  TROPONIN I (HIGH SENSITIVITY)   ____________________________________________  EKG  ED ECG REPORT I, Jene Every, the attending physician, personally viewed and interpreted this ECG.  Date: 01/15/2019  Rhythm: normal sinus rhythm QRS Axis: normal Intervals: normal ST/T Wave abnormalities: normal Narrative Interpretation: Unchanged from old EKG a  ____________________________________________  RADIOLOGY  Chest x-ray unremarkable, CT angiography negative ____________________________________________   PROCEDURES  Procedure(s) performed: No  Procedures   Critical Care performed: No ____________________________________________   INITIAL IMPRESSION / ASSESSMENT AND PLAN / ED COURSE  Pertinent labs & imaging results that were available during my care of the patient were reviewed  by me and considered in my medical decision making (see chart for details).  Patient presents with chest and arm pain as above, suspicious for musculoskeletal pain however also given the severity of pain sent for CT angiography of the chest to rule out dissection or other abnormality.  Lab work thus far is reassuring, troponin unremarkable x2.  EKG unchanged from prior.  Not consistent with ACS.  No PE or dissection on scan.  Patient received Toradol as well as Percocet but only minimal improvement of pain.  He will require mission for further evaluation of chest pain and pain control   ____________________________________________   FINAL CLINICAL IMPRESSION(S) / ED DIAGNOSES  Final diagnoses:  Atypical chest pain        Note:  This document was prepared using Dragon voice recognition software and may include unintentional dictation errors.   Jene EveryKinner, Aaryav Hopfensperger, MD 01/15/19 [phone]

## 2019-01-15 NOTE — Plan of Care (Signed)

## 2019-01-15 NOTE — Progress Notes (Signed)
[facility] - [place] [facility]     PATIENT NAME: Ethan Rosales    MR#:  [id number]  DATE OF BIRTH:  07/19/1960  SUBJECTIVE:   Patient presents to the hospital with atypical chest pain but also noted to have some left shoulder and neck pain.  Denies any shortness of breath nausea vomiting fever chills.  REVIEW OF SYSTEMS:    Review of Systems  Constitutional: Negative for chills and fever.  HENT: Negative for congestion and tinnitus.   Eyes: Negative for blurred vision and double vision.  Respiratory: Negative for cough, shortness of breath and wheezing.   Cardiovascular: Negative for chest pain, orthopnea and PND.  Gastrointestinal: Negative for abdominal pain, diarrhea, nausea and vomiting.  Genitourinary: Negative for dysuria and hematuria.  Musculoskeletal: Positive for joint pain (Left shoulder. ).  Neurological: Negative for dizziness, sensory change and focal weakness.  All other systems reviewed and are negative.   Nutrition: Heart Healthy Tolerating Diet: Yes Tolerating PT: Ambulatory.   DRUG ALLERGIES:   Allergies  Allergen Reactions   Hydromorphone Anaphylaxis    Other reaction(s): ANAPHYLAXIS   Morphine And Related Other (See Comments)    Syncope and stopped breathing   Tamiflu [Oseltamivir] Palpitations   Codeine Palpitations and Other (See Comments)    syncope   Penicillins Hives and Rash    VITALS:  Blood pressure (!) 141/93, pulse 64, temperature 98.2 F (36.8 C), temperature source Oral, resp. rate 18, height 6\' 3"  (1.905 m), weight 129.3 kg, SpO2 99 %.  PHYSICAL EXAMINATION:   Physical Exam  GENERAL:  58 y.o.-year-old patient lying in bed in no acute distress.  EYES: Pupils equal, round, reactive to light and accommodation. No scleral icterus. Extraocular muscles intact.  HEENT: Head atraumatic, normocephalic. Oropharynx and nasopharynx clear.  NECK:  Supple, no jugular venous distention. No thyroid enlargement, no  tenderness.  LUNGS: Normal breath sounds bilaterally, no wheezing, rales, rhonchi. No use of accessory muscles of respiration.  CARDIOVASCULAR: S1, S2 normal. No murmurs, rubs, or gallops.  ABDOMEN: Soft, nontender, nondistended. Bowel sounds present. No organomegaly or mass.  EXTREMITIES: No cyanosis, clubbing or edema b/l.    NEUROLOGIC: Cranial nerves II through XII are intact. No focal Motor or sensory deficits b/l.   PSYCHIATRIC: The patient is alert and oriented x 3.  SKIN: No obvious rash, lesion, or ulcer.    LABORATORY PANEL:   CBC Recent Labs  Lab 01/15/19 0010  WBC 10.7*  HGB 14.0  HCT 40.7  PLT 239   ------------------------------------------------------------------------------------------------------------------  Chemistries  Recent Labs  Lab 01/15/19 0010  NA 139  K 3.1*  CL 105  CO2 25  GLUCOSE 126*  BUN 10  CREATININE 1.42*  CALCIUM 9.4  AST 19  ALT 20  ALKPHOS 85  BILITOT 0.7   ------------------------------------------------------------------------------------------------------------------  Cardiac Enzymes No results for input(s): TROPONINI in the last 168 hours. ------------------------------------------------------------------------------------------------------------------  RADIOLOGY:  Dg Cervical Spine 2 Or 3 Views  Result Date: 01/15/2019 CLINICAL DATA:  Atypical chest pain. Pain radiates into the left arm and neck EXAM: CERVICAL SPINE - 2-3 VIEW COMPARISON:  None. FINDINGS: There is no evidence of cervical spine fracture or prevertebral soft tissue swelling. Alignment is normal. No other significant bone abnormalities are identified. IMPRESSION: Negative cervical spine radiographs. Electronically Signed   By: Elige KoHetal  Patel   On: 01/15/2019 12:43   Dg Chest Port 1 View  Result Date: 01/15/2019 CLINICAL DATA:  Chest pain EXAM: PORTABLE CHEST 1 VIEW COMPARISON:  09/12/2014 FINDINGS:  Streaky atelectasis left lung base. No pleural effusion or  pneumothorax. Cardiomediastinal silhouette within normal limits. IMPRESSION: Streaky atelectasis left lung base.  Otherwise negative. Electronically Signed   By: Donavan Foil M.D.   On: 01/15/2019 00:53   Dg Shoulder Left  Result Date: 01/15/2019 CLINICAL DATA:  Chest pain radiating to the left shoulder and arm. EXAM: LEFT SHOULDER - 2+ VIEW COMPARISON:  None. FINDINGS: There is no evidence of fracture or dislocation. There is no evidence of arthropathy or other focal bone abnormality. Soft tissues are unremarkable. IMPRESSION: Negative. Electronically Signed   By: Titus Dubin M.D.   On: 01/15/2019 12:42   Ct Angio Chest Aorta W And/or Wo Contrast  Result Date: 01/15/2019 CLINICAL DATA:  Complex chest pain radiating to left arm. Cold left hand. EXAM: CT ANGIOGRAPHY CHEST WITH CONTRAST TECHNIQUE: Multidetector CT imaging of the chest was performed using the standard protocol during bolus administration of intravenous contrast. Multiplanar CT image reconstructions and MIPs were obtained to evaluate the vascular anatomy. CONTRAST:  181mL OMNIPAQUE IOHEXOL 350 MG/ML SOLN COMPARISON:  None FINDINGS: Cardiovascular: There is no dissection. No large centrally located pulmonary embolus. The visualized portions of the left subclavian artery and left axillary artery are unremarkable. The right subclavian artery is not well evaluated secondary to extensive streak artifact. Heart size is essentially normal. There is no significant pericardial effusion Mediastinum/Nodes: --No mediastinal or hilar lymphadenopathy. --No axillary lymphadenopathy. --No supraclavicular lymphadenopathy. --Normal thyroid gland. --The esophagus is unremarkable Lungs/Pleura: No pulmonary nodules or masses. No pleural effusion or pneumothorax. No focal airspace consolidation. No focal pleural abnormality. Upper Abdomen: No acute abnormality. Musculoskeletal: No chest wall abnormality. No acute or significant osseous findings. Review of the  MIP images confirms the above findings. IMPRESSION: No acute intrathoracic process to explain the patient's symptoms. Electronically Signed   By: Constance Holster M.D.   On: 01/15/2019 02:28     ASSESSMENT AND PLAN:   58 year old male with past medical history of diabetes, hypertension, GERD, glaucoma who presents to the hospital due to chest pain/neck pain and shoulder pain.  1.  Neck pain/shoulder pain- suspected to be musculoskeletal in nature.  Appreciate orthopedics input this is subacromial impingement. -Continue NSAIDs and supportive care.  Patient underwent a cortisone injection to the left shoulder today. - We will continue to follow.  2.  Chest pain-this is likely referred pain from his shoulder/neck pain.  His cardiac markers are negative, EKG is showing no acute ST-T wave changes.  This is likely atypical/noncardiac chest pain.  3.  Essential hypertension-continue losartan.  4.  Hyperlipidemia-continue Crestor.  5.  Hypokalemia-we will give patient potassium supplements repeat level in the morning.  6.  GERD-continue Protonix.  7.  Glaucoma-continue latanoprost eyedrops.     All the records are reviewed and case discussed with Care Management/Social Worker. Management plans discussed with the patient, family and they are in agreement.  CODE STATUS: Full code  DVT Prophylaxis: Lovenox  TOTAL TIME TAKING CARE OF THIS PATIENT: 30 minutes.   POSSIBLE D/C IN 1-2 DAYS, DEPENDING ON CLINICAL CONDITION.   Henreitta Leber M.D on 01/15/2019 at 1:24 PM  Between 7am to 6pm - Pager - [phone]  After 6pm go to [email or web address] - [employer] [place] [facility]  Office  [phone]  CC: Primary care physician; Andrey Campanile, MD

## 2019-01-15 NOTE — ED Notes (Signed)
ED TO INPATIENT HANDOFF REPORT  ED Nurse Name and Phone #: gracie  S Name/Age/Gender Ethan Rosales 58 y.o. male Room/Bed: ED03A/ED03A  Code Status   Code Status: Prior  Home/SNF/Other Home Patient oriented to: self, place, time and situation Is this baseline? Yes   Triage Complete: Triage complete  Chief Complaint Upper L Shoulder Chest Pain  Triage Note Pt to ED via EMS from home. Pt states chest pain started 3 hrs ago, pt has pain to left upper chest radiating to left arm and neck. Pt not moving left arm because of pain. Pt denies any trauma to area. Pain is worse when pt moves. Pt states frequent urination. cbg 125   Allergies Allergies  Allergen Reactions  . Hydromorphone Anaphylaxis    Other reaction(s): ANAPHYLAXIS  . Morphine And Related Other (See Comments)    Syncope and stopped breathing  . Tamiflu [Oseltamivir] Palpitations  . Codeine Palpitations and Other (See Comments)    syncope  . Penicillins Hives and Rash    Level of Care/Admitting Diagnosis ED Disposition    ED Disposition Condition Comment   Admit  [facility]ospital [facility] [[id number]]  Level of Care: Telemetry [5]  Covid Evaluation: Asymptomatic Screening Protocol (No Symptoms)  Diagnosis: Atypical chest pain [297556]  Admitting Physician: Arnaldo NatalDIAMOND, MICHAEL S [[id number]]  Attending Physician: Arnaldo NatalIAMOND, MICHAEL S [phone][[id number]]  PT Class (Do Not Modify): Observation [104]  PT Acc Code (Do Not Modify): Observation [10022]       B Medical/Surgery History Past Medical History:  Diagnosis Date  . Acid reflux   . Arthritis    "probably in my knees" (09/12/2014)  . Chronic lower back pain   . Complication of anesthesia    "anesthesia burndt my skin in ~ 04/2014 when I had colonoscopy"  . GERD (gastroesophageal reflux disease)   . Heart murmur    "when I was little"  . High cholesterol   . Lumbar herniated disc    "L3, 4, 5" (09/12/2014)  . Myocardial infarction (HCC) 2003   . Pneumonia    "once when I was real little"  . Refusal of blood transfusions as patient is Jehovah's Witness   . Shortness of breath    ' AT TIMES "  . Stroke [facility](HCC) 2003   LEFT SIDE RESIDUAL (09/12/2014)  . Type II diabetes mellitus (HCC)    Past Surgical History:  Procedure Laterality Date  . CARDIAC CATHETERIZATION  02/25/2013  . FOOT SURGERY Bilateral 1999   "took bones out of my toes so I could walk"  . LEFT HEART CATHETERIZATION WITH CORONARY ANGIOGRAM N/A 02/25/2013   Procedure: LEFT HEART CATHETERIZATION WITH CORONARY ANGIOGRAM;  Surgeon: Robynn PaneMohan N Harwani, MD;  Location: [facility] CATH LAB;  Service: Cardiovascular;  Laterality: N/A;     A IV Location/Drains/Wounds Patient Lines/Drains/Airways Status   Active Line/Drains/Airways    Name:   Placement date:   Placement time:   Site:   Days:   Peripheral IV 01/15/19 Right Forearm   01/15/19    0039    Forearm   less than 1          Intake/Output Last 24 hours No intake or output data in the 24 hours ending 01/15/19 [id number]  Labs/Imaging Results for orders placed or performed during the hospital encounter of 01/14/19 (from the past 48 hour(s))  Urinalysis, Complete w Microscopic     Status: Abnormal   Collection Time: 01/15/19 12:09 AM  Result Value Ref Range   Color, Urine STRAW (  A) YELLOW   APPearance CLEAR (A) CLEAR   Specific Gravity, Urine 1.005 1.005 - 1.030   pH 8.0 5.0 - 8.0   Glucose, UA NEGATIVE NEGATIVE mg/dL   Hgb urine dipstick NEGATIVE NEGATIVE   Bilirubin Urine NEGATIVE NEGATIVE   Ketones, ur NEGATIVE NEGATIVE mg/dL   Protein, ur NEGATIVE NEGATIVE mg/dL   Nitrite NEGATIVE NEGATIVE   Leukocytes,Ua NEGATIVE NEGATIVE   WBC, UA NONE SEEN 0 - 5 WBC/hpf   Bacteria, UA NONE SEEN NONE SEEN   Squamous Epithelial / LPF NONE SEEN 0 - 5    Comment: Performed at [facility], [address] [place], [place] [address]  CBC     Status: Abnormal   Collection Time: 01/15/19 12:10 AM  Result Value Ref Range    WBC 10.7 (H) 4.0 - 10.5 K/uL   RBC 4.89 4.22 - 5.81 MIL/uL   Hemoglobin 14.0 13.0 - 17.0 g/dL   HCT 98.2 64.1 - 58.3 %   MCV 83.2 80.0 - 100.0 fL   MCH 28.6 26.0 - 34.0 pg   MCHC 34.4 30.0 - 36.0 g/dL   RDW 09.4 07.6 - 80.8 %   Platelets 239 150 - 400 K/uL   nRBC 0.0 0.0 - 0.2 %    Comment: Performed at [facility], [address] [place], [place] [address]  Comprehensive metabolic panel     Status: Abnormal   Collection Time: 01/15/19 12:10 AM  Result Value Ref Range   Sodium 139 135 - 145 mmol/L   Potassium 3.1 (L) 3.5 - 5.1 mmol/L   Chloride 105 98 - 111 mmol/L   CO2 25 22 - 32 mmol/L   Glucose, Bld 126 (H) 70 - 99 mg/dL   BUN 10 6 - 20 mg/dL   Creatinine, Ser 1.59 (H) 0.61 - 1.24 mg/dL   Calcium 9.4 8.9 - 45.8 mg/dL   Total Protein 7.5 6.5 - 8.1 g/dL   Albumin 4.1 3.5 - 5.0 g/dL   AST 19 15 - 41 U/L   ALT 20 0 - 44 U/L   Alkaline Phosphatase 85 38 - 126 U/L   Total Bilirubin 0.7 0.3 - 1.2 mg/dL   GFR calc non Af Amer 55 (L) >60 mL/min   GFR calc Af Amer >60 >60 mL/min   Anion gap 9 5 - 15    Comment: Performed at [facility], [address] [place], [place] [address]  Troponin I (High Sensitivity)     Status: None   Collection Time: 01/15/19 12:10 AM  Result Value Ref Range   Troponin I (High Sensitivity) 9 <18 ng/L    Comment: (NOTE) Elevated high sensitivity troponin I (hsTnI) values and significant  changes across serial measurements may suggest ACS but many other  chronic and acute conditions are known to elevate hsTnI results.  Refer to the "Links" section for chest pain algorithms and additional  guidance. Performed at [facility], [address] [place], [place] [address]   Troponin I (High Sensitivity)     Status: None   Collection Time: 01/15/19  2:08 AM  Result Value Ref Range   Troponin I (High Sensitivity) 7 <18 ng/L    Comment: (NOTE) Elevated high sensitivity troponin I (hsTnI) values and significant  changes  across serial measurements may suggest ACS but many other  chronic and acute conditions are known to elevate hsTnI results.  Refer to the "Links" section for chest pain algorithms and additional  guidance. Performed at [facility], [place], [place] [address]    Dg Chest Port 1 View  Result Date: 01/15/2019 CLINICAL DATA:  Chest pain EXAM: PORTABLE CHEST 1 VIEW COMPARISON:  09/12/2014 FINDINGS: Streaky atelectasis left lung base. No pleural effusion or pneumothorax. Cardiomediastinal silhouette within normal limits. IMPRESSION: Streaky atelectasis left lung base.  Otherwise negative. Electronically Signed   By: Donavan Foil M.D.   On: 01/15/2019 00:53   Ct Angio Chest Aorta W And/or Wo Contrast  Result Date: 01/15/2019 CLINICAL DATA:  Complex chest pain radiating to left arm. Cold left hand. EXAM: CT ANGIOGRAPHY CHEST WITH CONTRAST TECHNIQUE: Multidetector CT imaging of the chest was performed using the standard protocol during bolus administration of intravenous contrast. Multiplanar CT image reconstructions and MIPs were obtained to evaluate the vascular anatomy. CONTRAST:  116mL OMNIPAQUE IOHEXOL 350 MG/ML SOLN COMPARISON:  None FINDINGS: Cardiovascular: There is no dissection. No large centrally located pulmonary embolus. The visualized portions of the left subclavian artery and left axillary artery are unremarkable. The right subclavian artery is not well evaluated secondary to extensive streak artifact. Heart size is essentially normal. There is no significant pericardial effusion Mediastinum/Nodes: --No mediastinal or hilar lymphadenopathy. --No axillary lymphadenopathy. --No supraclavicular lymphadenopathy. --Normal thyroid gland. --The esophagus is unremarkable Lungs/Pleura: No pulmonary nodules or masses. No pleural effusion or pneumothorax. No focal airspace consolidation. No focal pleural abnormality. Upper Abdomen: No acute abnormality. Musculoskeletal: No chest  wall abnormality. No acute or significant osseous findings. Review of the MIP images confirms the above findings. IMPRESSION: No acute intrathoracic process to explain the patient's symptoms. Electronically Signed   By: Constance Holster M.D.   On: 01/15/2019 02:28    Pending Labs [employer] (From admission, onward)    Start     Ordered   Signed and Held  Hemoglobin A1c  Once,   R    Comments: To assess prior glycemic control    Signed and Held   Signed and Held  Creatinine, serum  (enoxaparin (LOVENOX)    CrCl >/= 30 ml/min)  Weekly,   R    Comments: while on enoxaparin therapy    Signed and Held   Signed and Held  TSH  Add-on,   R     Signed and Held          Vitals/Pain Today's Vitals   01/15/19 0330 01/15/19 0400 01/15/19 0430 01/15/19 0435  BP: 140/88 134/85 127/82   Pulse: 83 79 71   Resp: (!) 21 19 18    Temp:      TempSrc:      SpO2: 99% 98% 98%   Weight:      Height:      PainSc:    Asleep    Isolation Precautions No active isolations  Medications Medications  iohexol (OMNIPAQUE) 350 MG/ML injection 125 mL (125 mLs Intravenous Contrast Given 01/15/19 0154)  ketorolac (TORADOL) 30 MG/ML injection 30 mg (30 mg Intravenous Given 01/15/19 0208)  oxyCODONE-acetaminophen (PERCOCET/ROXICET) 5-325 MG per tablet 1 tablet (1 tablet Oral Given 01/15/19 0324)    Mobility walks Low fall risk   Focused Assessments musculoskeletal   R Recommendations: See Admitting Provider Note  Report given to:   Additional Notes:

## 2019-01-15 NOTE — ED Notes (Signed)
Patient transported to CT 

## 2019-01-15 NOTE — ED Notes (Signed)
Continues L chest pain, alert and oriented. Await admission.

## 2019-01-15 NOTE — ED Notes (Signed)
Attempt for report, nurse still receiving report from night shift. Will return call.

## 2019-01-16 LAB — GLUCOSE, CAPILLARY: Glucose-Capillary: 135 mg/dL — ABNORMAL HIGH (ref 70–99)

## 2019-01-16 LAB — NOVEL CORONAVIRUS, NAA (HOSP ORDER, SEND-OUT TO REF LAB; TAT 18-24 HRS): SARS-CoV-2, NAA: NOT DETECTED

## 2019-01-16 MED ORDER — TRAMADOL HCL 50 MG PO TABS: 50.0000 mg | ORAL_TABLET | Freq: Four times a day (QID) | ORAL | 0 refills | Status: AC | PRN

## 2019-01-16 NOTE — Discharge Instructions (Signed)

## 2019-01-16 NOTE — Discharge Summary (Signed)
[facility] NAME: Ethan Rosales    MR#:  [id number]  DATE OF BIRTH:  1960/08/27  DATE OF ADMISSION:  01/14/2019 ADMITTING PHYSICIAN: Harrie Foreman, MD  DATE OF DISCHARGE: 01/16/2019  PRIMARY CARE PHYSICIAN: Andrey Campanile, MD    ADMISSION DIAGNOSIS:  Atypical chest pain [R07.89]  DISCHARGE DIAGNOSIS:  Active Problems:   Atypical chest pain   SECONDARY DIAGNOSIS:   Past Medical History:  Diagnosis Date  . Acid reflux   . Arthritis    "probably in my knees" (09/12/2014)  . Chronic lower back pain   . Complication of anesthesia    "anesthesia burndt my skin in ~ 04/2014 when I had colonoscopy"  . GERD (gastroesophageal reflux disease)   . Heart murmur    "when I was little"  . High cholesterol   . Lumbar herniated disc    "L3, 4, 5" (09/12/2014)  . Myocardial infarction ([facility]) 2003  . Pneumonia    "once when I was real little"  . Refusal of blood transfusions as patient is Jehovah's Witness   . Shortness of breath    ' AT TIMES "  . Stroke [facility]) 2003   LEFT SIDE RESIDUAL (09/12/2014)  . Type II diabetes mellitus [facility])     HOSPITAL COURSE:   58 year old male with past medical history of diabetes, hypertension, GERD, glaucoma who presents to the hospital due to chest pain/neck pain and shoulder pain.  1.  Neck pain/shoulder pain-  musculoskeletal in nature.  Appreciate orthopedics input this is subacromial impingement. Patient underwent a cortisone injection to the left shoulder.  His pain has improved and movement in his left arm has also improved. -Orthopedics recommended discharge on NSAIDs and outpatient follow-up with his orthopedic surgeon.  Patient has had previous shoulder surgery and will follow-up with his orthopedic surgeon at [facility]. -Patient is in agreement with this plan for discharge home today.  2.  Chest pain-this is likely referred pain from his shoulder/neck pain.  His cardiac markers were  negative, EKG is showing no acute ST-T wave changes.  3.  Essential hypertension- pt. Will continue losartan.  4.  Hyperlipidemia- pt. Will continue Crestor.  5.  Hypokalemia- improved with supplementation.   6.  GERD- pt. Will continue Protonix.  7.  Glaucoma- pt. Will continue latanoprost eyedrops.  DISCHARGE CONDITIONS:   Stable.   CONSULTS OBTAINED:    DRUG ALLERGIES:   Allergies  Allergen Reactions  . Hydromorphone Anaphylaxis    Other reaction(s): ANAPHYLAXIS  . Morphine And Related Other (See Comments)    Syncope and stopped breathing  . Tamiflu [Oseltamivir] Palpitations  . Codeine Palpitations and Other (See Comments)    syncope  . Penicillins Hives and Rash    DISCHARGE MEDICATIONS:   Allergies as of 01/16/2019      Reactions   Hydromorphone Anaphylaxis   Other reaction(s): ANAPHYLAXIS   Morphine And Related Other (See Comments)   Syncope and stopped breathing   Tamiflu [oseltamivir] Palpitations   Codeine Palpitations, Other (See Comments)   syncope   Penicillins Hives, Rash      Medication List    TAKE these medications   gabapentin 300 MG capsule Commonly known as: NEURONTIN Take 300 mg by mouth at bedtime.   losartan 100 MG tablet Commonly known as: COZAAR Take 100 mg by mouth daily.   omeprazole 20 MG capsule Commonly known as: PRILOSEC Take 20 mg by mouth daily.   Ozempic (0.25 or 0.5  MG/DOSE) 2 MG/1.5ML Sopn Generic drug: Semaglutide(0.25 or 0.5MG /DOS) Inject 0.5 mg into the skin every Friday.   rosuvastatin 5 MG tablet Commonly known as: CRESTOR Take 10 mg by mouth daily.   tamsulosin 0.4 MG Caps capsule Commonly known as: FLOMAX Take 0.4 mg by mouth daily.   traMADol 50 MG tablet Commonly known as: ULTRAM Take 1 tablet (50 mg total) by mouth every 6 (six) hours as needed for moderate pain.   Travoprost (BAK Free) 0.004 % Soln ophthalmic solution Commonly known as: TRAVATAN Place 1 drop into both eyes at bedtime.          DISCHARGE INSTRUCTIONS:   DIET:  Cardiac diet  DISCHARGE CONDITION:  Stable  ACTIVITY:  Activity as tolerated  OXYGEN:  Home Oxygen: No.   Oxygen Delivery: room air  DISCHARGE LOCATION:  home   If you experience worsening of your admission symptoms, develop shortness of breath, life threatening emergency, suicidal or homicidal thoughts you must seek medical attention immediately by calling 911 or calling your MD immediately  if symptoms less severe.  You Must read complete instructions/literature along with all the possible adverse reactions/side effects for all the Medicines you take and that have been prescribed to you. Take any new Medicines after you have completely understood and accpet all the possible adverse reactions/side effects.   Please note  You were cared for by a hospitalist during your hospital stay. If you have any questions about your discharge medications or the care you received while you were in the hospital after you are discharged, you can call the unit and asked to speak with the hospitalist on call if the hospitalist that took care of you is not available. Once you are discharged, your primary care physician will handle any further medical issues. Please note that NO REFILLS for any discharge medications will be authorized once you are discharged, as it is imperative that you return to your primary care physician (or establish a relationship with a primary care physician if you do not have one) for your aftercare needs so that they can reassess your need for medications and monitor your lab values.     Today   Patient left shoulder pain is still there but improved.  He has more movement in the left arm and shoulder.  Had a cortisone injection yesterday.  Will discharge home on some NSAIDs and tramadol as needed.  VITAL SIGNS:  Blood pressure 109/64, pulse 67, temperature 97.9 F (36.6 C), temperature source Oral, resp. rate 20, height 6\' 3"   (1.905 m), weight 121.5 kg, SpO2 100 %.  I/O:    Intake/Output Summary (Last 24 hours) at 01/16/2019 1247 Last data filed at 01/16/2019 0000 Gross per 24 hour  Intake -  Output 0 ml  Net 0 ml    PHYSICAL EXAMINATION:  GENERAL:  58 y.o.-year-old patient lying in the bed with no acute distress.  EYES: Pupils equal, round, reactive to light and accommodation. No scleral icterus. Extraocular muscles intact.  HEENT: Head atraumatic, normocephalic. Oropharynx and nasopharynx clear.  NECK:  Supple, no jugular venous distention. No thyroid enlargement, no tenderness.  LUNGS: Normal breath sounds bilaterally, no wheezing, rales,rhonchi. No use of accessory muscles of respiration.  CARDIOVASCULAR: S1, S2 normal. No murmurs, rubs, or gallops.  ABDOMEN: Soft, non-tender, non-distended. Bowel sounds present. No organomegaly or mass.  EXTREMITIES: No pedal edema, cyanosis, or clubbing.  NEUROLOGIC: Cranial nerves II through XII are intact. No focal motor or sensory defecits b/l.  PSYCHIATRIC: The  patient is alert and oriented x 3.  SKIN: No obvious rash, lesion, or ulcer.   DATA REVIEW:   CBC Recent Labs  Lab 01/15/19 0010  WBC 10.7*  HGB 14.0  HCT 40.7  PLT 239    Chemistries  Recent Labs  Lab 01/15/19 0010  NA 139  K 3.1*  CL 105  CO2 25  GLUCOSE 126*  BUN 10  CREATININE 1.42*  CALCIUM 9.4  AST 19  ALT 20  ALKPHOS 85  BILITOT 0.7    Cardiac Enzymes No results for input(s): TROPONINI in the last 168 hours.  Microbiology Results  No results found for this or any previous visit.  RADIOLOGY:  Dg Cervical Spine 2 Or 3 Views  Result Date: 01/15/2019 CLINICAL DATA:  Atypical chest pain. Pain radiates into the left arm and neck EXAM: CERVICAL SPINE - 2-3 VIEW COMPARISON:  None. FINDINGS: There is no evidence of cervical spine fracture or prevertebral soft tissue swelling. Alignment is normal. No other significant bone abnormalities are identified. IMPRESSION: Negative  cervical spine radiographs. Electronically Signed   By: Elige KoHetal  Patel   On: 01/15/2019 12:43   Dg Chest Port 1 View  Result Date: 01/15/2019 CLINICAL DATA:  Chest pain EXAM: PORTABLE CHEST 1 VIEW COMPARISON:  09/12/2014 FINDINGS: Streaky atelectasis left lung base. No pleural effusion or pneumothorax. Cardiomediastinal silhouette within normal limits. IMPRESSION: Streaky atelectasis left lung base.  Otherwise negative. Electronically Signed   By: Jasmine PangKim  Fujinaga M.D.   On: 01/15/2019 00:53   Dg Shoulder Left  Result Date: 01/15/2019 CLINICAL DATA:  Chest pain radiating to the left shoulder and arm. EXAM: LEFT SHOULDER - 2+ VIEW COMPARISON:  None. FINDINGS: There is no evidence of fracture or dislocation. There is no evidence of arthropathy or other focal bone abnormality. Soft tissues are unremarkable. IMPRESSION: Negative. Electronically Signed   By: Obie DredgeWilliam T Derry M.D.   On: 01/15/2019 12:42   Ct Angio Chest Aorta W And/or Wo Contrast  Result Date: 01/15/2019 CLINICAL DATA:  Complex chest pain radiating to left arm. Cold left hand. EXAM: CT ANGIOGRAPHY CHEST WITH CONTRAST TECHNIQUE: Multidetector CT imaging of the chest was performed using the standard protocol during bolus administration of intravenous contrast. Multiplanar CT image reconstructions and MIPs were obtained to evaluate the vascular anatomy. CONTRAST:  125mL OMNIPAQUE IOHEXOL 350 MG/ML SOLN COMPARISON:  None FINDINGS: Cardiovascular: There is no dissection. No large centrally located pulmonary embolus. The visualized portions of the left subclavian artery and left axillary artery are unremarkable. The right subclavian artery is not well evaluated secondary to extensive streak artifact. Heart size is essentially normal. There is no significant pericardial effusion Mediastinum/Nodes: --No mediastinal or hilar lymphadenopathy. --No axillary lymphadenopathy. --No supraclavicular lymphadenopathy. --Normal thyroid gland. --The esophagus is  unremarkable Lungs/Pleura: No pulmonary nodules or masses. No pleural effusion or pneumothorax. No focal airspace consolidation. No focal pleural abnormality. Upper Abdomen: No acute abnormality. Musculoskeletal: No chest wall abnormality. No acute or significant osseous findings. Review of the MIP images confirms the above findings. IMPRESSION: No acute intrathoracic process to explain the patient's symptoms. Electronically Signed   By: Katherine Mantlehristopher  Green M.D.   On: 01/15/2019 02:28      Management plans discussed with the patient, family and they are in agreement.  CODE STATUS:     Code Status Orders  (From admission, onward)         Start     Ordered   01/15/19 0754  Full code  Continuous  01/15/19 0753          TOTAL TIME TAKING CARE OF THIS PATIENT: 40 minutes.    Houston SirenVivek J Mayetta Castleman M.D on 01/16/2019 at 12:47 PM  Between 7am to 6pm - Pager - [phone]  After 6pm go to [email or web address] - Scientist, research (life sciences)password [email or web address]  [facility]  Office  [phone]  CC: Primary care physician; Zandra AbtsYu, Rupal Lakhani, MD

## 2019-01-16 NOTE — Plan of Care (Signed)
Discharge instructions provided to pt.  All questions addressed.  Understanding verified through teach back.  Awaiting transportation home via POV.   Problem: Education: Goal: Knowledge of General Education information will improve Description: Including pain rating scale, medication(s)/side effects and non-pharmacologic comfort measures Outcome: Completed/Met   Problem: Health Behavior/Discharge Planning: Goal: Ability to manage health-related needs will improve Outcome: Completed/Met   Problem: Clinical Measurements: Goal: Ability to maintain clinical measurements within normal limits will improve Outcome: Completed/Met Goal: Will remain free from infection Outcome: Completed/Met Goal: Diagnostic test results will improve Outcome: Completed/Met Goal: Respiratory complications will improve Outcome: Completed/Met Goal: Cardiovascular complication will be avoided Outcome: Completed/Met   Problem: Activity: Goal: Risk for activity intolerance will decrease Outcome: Completed/Met   Problem: Nutrition: Goal: Adequate nutrition will be maintained Outcome: Completed/Met   Problem: Coping: Goal: Level of anxiety will decrease Outcome: Completed/Met   Problem: Elimination: Goal: Will not experience complications related to bowel motility Outcome: Completed/Met Goal: Will not experience complications related to urinary retention Outcome: Completed/Met   Problem: Pain Managment: Goal: General experience of comfort will improve Outcome: Completed/Met   Problem: Safety: Goal: Ability to remain free from injury will improve Outcome: Completed/Met   Problem: Skin Integrity: Goal: Risk for impaired skin integrity will decrease Outcome: Completed/Met   Problem: Education: Goal: Knowledge of General Education information will improve Description: Including pain rating scale, medication(s)/side effects and non-pharmacologic comfort measures Outcome: Completed/Met   Problem:  Health Behavior/Discharge Planning: Goal: Ability to manage health-related needs will improve Outcome: Completed/Met   Problem: Clinical Measurements: Goal: Ability to maintain clinical measurements within normal limits will improve Outcome: Completed/Met Goal: Will remain free from infection Outcome: Completed/Met Goal: Diagnostic test results will improve Outcome: Completed/Met Goal: Respiratory complications will improve Outcome: Completed/Met Goal: Cardiovascular complication will be avoided Outcome: Completed/Met   Problem: Activity: Goal: Risk for activity intolerance will decrease Outcome: Completed/Met   Problem: Nutrition: Goal: Adequate nutrition will be maintained Outcome: Completed/Met   Problem: Coping: Goal: Level of anxiety will decrease Outcome: Completed/Met   Problem: Elimination: Goal: Will not experience complications related to bowel motility Outcome: Completed/Met Goal: Will not experience complications related to urinary retention Outcome: Completed/Met   Problem: Pain Managment: Goal: General experience of comfort will improve Outcome: Completed/Met   Problem: Safety: Goal: Ability to remain free from injury will improve Outcome: Completed/Met   Problem: Skin Integrity: Goal: Risk for impaired skin integrity will decrease Outcome: Completed/Met

## 2019-01-16 NOTE — Plan of Care (Signed)
  Problem: Education: Goal: Knowledge of General Education information will improve Description: Including pain rating scale, medication(s)/side effects and non-pharmacologic comfort measures Outcome: Progressing   Problem: Pain Managment: Goal: General experience of comfort will improve Outcome: Progressing   Problem: Safety: Goal: Ability to remain free from injury will improve Outcome: Progressing   

## 2020-04-25 ENCOUNTER — Ambulatory Visit (HOSPITAL_COMMUNITY)
Admission: EM | Admit: 2020-04-25 | Discharge: 2020-04-25 | Disposition: A | Discharge status: Home / Self Care | Payer: Medicare HMO | Attending: Family Medicine | Admitting: Family Medicine

## 2020-04-25 ENCOUNTER — Encounter (HOSPITAL_COMMUNITY): Payer: Self-pay | Admitting: *Deleted

## 2020-04-25 ENCOUNTER — Other Ambulatory Visit: Payer: Self-pay

## 2020-04-25 DIAGNOSIS — R509 Fever, unspecified: Secondary | ICD-10-CM | POA: Diagnosis not present

## 2020-04-25 DIAGNOSIS — Z87891 Personal history of nicotine dependence: Secondary | ICD-10-CM | POA: Diagnosis not present

## 2020-04-25 DIAGNOSIS — Z885 Allergy status to narcotic agent status: Secondary | ICD-10-CM | POA: Diagnosis not present

## 2020-04-25 DIAGNOSIS — Z880 Allergy status to penicillin: Secondary | ICD-10-CM | POA: Diagnosis not present

## 2020-04-25 DIAGNOSIS — U071 COVID-19: Secondary | ICD-10-CM | POA: Insufficient documentation

## 2020-04-25 NOTE — ED Triage Notes (Signed)
PT reports fever started last night and this morning.

## 2020-04-25 NOTE — Discharge Instructions (Signed)
You have been tested for COVID-19 today. °If your test returns positive, you will receive a phone call from [facility] regarding your results. °Negative test results are not called. °Both positive and negative results area always visible on MyChart. °If you do not have a MyChart account, sign up instructions are provided in your discharge papers. °Please do not hesitate to contact us should you have questions or concerns. ° °

## 2020-04-25 NOTE — ED Provider Notes (Signed)
[facility]ENTER   [id number] 04/25/20 Arrival Time: 1654  ASSESSMENT & PLAN:  1. Fever, unspecified fever cause     COVID-19 testing sent. See letter/work note on file for self-isolation guidelines. OTC symptom care as needed.    Follow-up Information    Zandra Abts, MD.   Specialty: Family Medicine Why: As needed. Contact information: [address] [place] [address] [phone]               Reviewed expectations re: course of current medical issues. Questions answered. Outlined signs and symptoms indicating need for more acute intervention. Understanding verbalized. After Visit Summary given.   SUBJECTIVE: History from: patient. Ethan Rosales is a 59 y.o. male who presents with worries regarding COVID-19. Known COVID-19 contact: none. Recent travel: none. Reports: fever last evening; 101 F. Some chills2. Denies: difficulty breathing. Normal PO intake without n/v/d.    OBJECTIVE:  Vitals:   04/25/20 1910  BP: 114/73  Pulse: 78  Resp: 16  Temp: 98.5 F (36.9 C)  TempSrc: Oral  SpO2: 98%    General appearance: alert; no distress Eyes: PERRLA; EOMI; conjunctiva normal HENT: [place]; AT; with mild nasal congestion Neck: supple  Lungs: speaks full sentences without difficulty; unlabored Extremities: no edema Skin: warm and dry Neurologic: normal gait Psychological: alert and cooperative; normal mood and affect  Labs:  Labs Reviewed  SARS CORONAVIRUS 2 (TAT 6-24 HRS)      Allergies  Allergen Reactions  . Hydromorphone Anaphylaxis    Other reaction(s): ANAPHYLAXIS  . Morphine And Related Other (See Comments)    Syncope and stopped breathing  . Tamiflu [Oseltamivir] Palpitations  . Codeine Palpitations and Other (See Comments)    syncope  . Penicillins Hives and Rash    Past Medical History:  Diagnosis Date  . Acid reflux   . Arthritis    "probably in my knees" (09/12/2014)  . Chronic lower back pain   .  Complication of anesthesia    "anesthesia burndt my skin in ~ 04/2014 when I had colonoscopy"  . GERD (gastroesophageal reflux disease)   . Heart murmur    "when I was little"  . High cholesterol   . Lumbar herniated disc    "L3, 4, 5" (09/12/2014)  . Myocardial infarction (HCC) 2003  . Pneumonia    "once when I was real little"  . Refusal of blood transfusions as patient is Jehovah's Witness   . Shortness of breath    ' AT TIMES "  . Stroke [facility]) 2003   LEFT SIDE RESIDUAL (09/12/2014)  . Type II diabetes mellitus (HCC)    Social History   Socioeconomic History  . Marital status: Divorced    Spouse name: Not on file  . Number of children: Not on file  . Years of education: Not on file  . Highest education level: Not on file  Occupational History  . Not on file  Tobacco Use  . Smoking status: Former Smoker    Packs/day: 1.00    Years: 6.00    Pack years: 6.00    Types: Cigarettes  . Smokeless tobacco: Never Used  . Tobacco comment: "stopped smoking in the 1990's"  Vaping Use  . Vaping Use: Never used  Substance and Sexual Activity  . Alcohol use: Yes    Comment: "stopped drinking in the 1990's"  . Drug use: No  . Sexual activity: Not Currently  Other Topics Concern  . Not on file  Social History Narrative   **  Merged History Encounter **       Social Determinants of Health   Financial Resource Strain: Not on file  Food Insecurity: Not on file  Transportation Needs: Not on file  Physical Activity: Not on file  Stress: Not on file  Social Connections: Not on file  Intimate Partner Violence: Not on file   History reviewed. No pertinent family history. Past Surgical History:  Procedure Laterality Date  . CARDIAC CATHETERIZATION  02/25/2013  . FOOT SURGERY Bilateral 1999   "took bones out of my toes so I could walk"  . LEFT HEART CATHETERIZATION WITH CORONARY ANGIOGRAM N/A 02/25/2013   Procedure: LEFT HEART CATHETERIZATION WITH CORONARY ANGIOGRAM;  Surgeon:  Robynn Pane, MD;  Location: [facility] CATH LAB;  Service: Cardiovascular;  Laterality: N/A;     Mardella Layman, MD 04/25/20 1927

## 2020-04-26 LAB — SARS CORONAVIRUS 2 (TAT 6-24 HRS): SARS Coronavirus 2: POSITIVE — AB

## 2020-10-03 IMAGING — DX DG CHEST 1V PORT
1 series · 1 of 1 positions shown · non-contrast
Comparison: 09/12/2014

CLINICAL DATA: Chest pain

EXAM:
PORTABLE CHEST 1 VIEW

[chest ap]
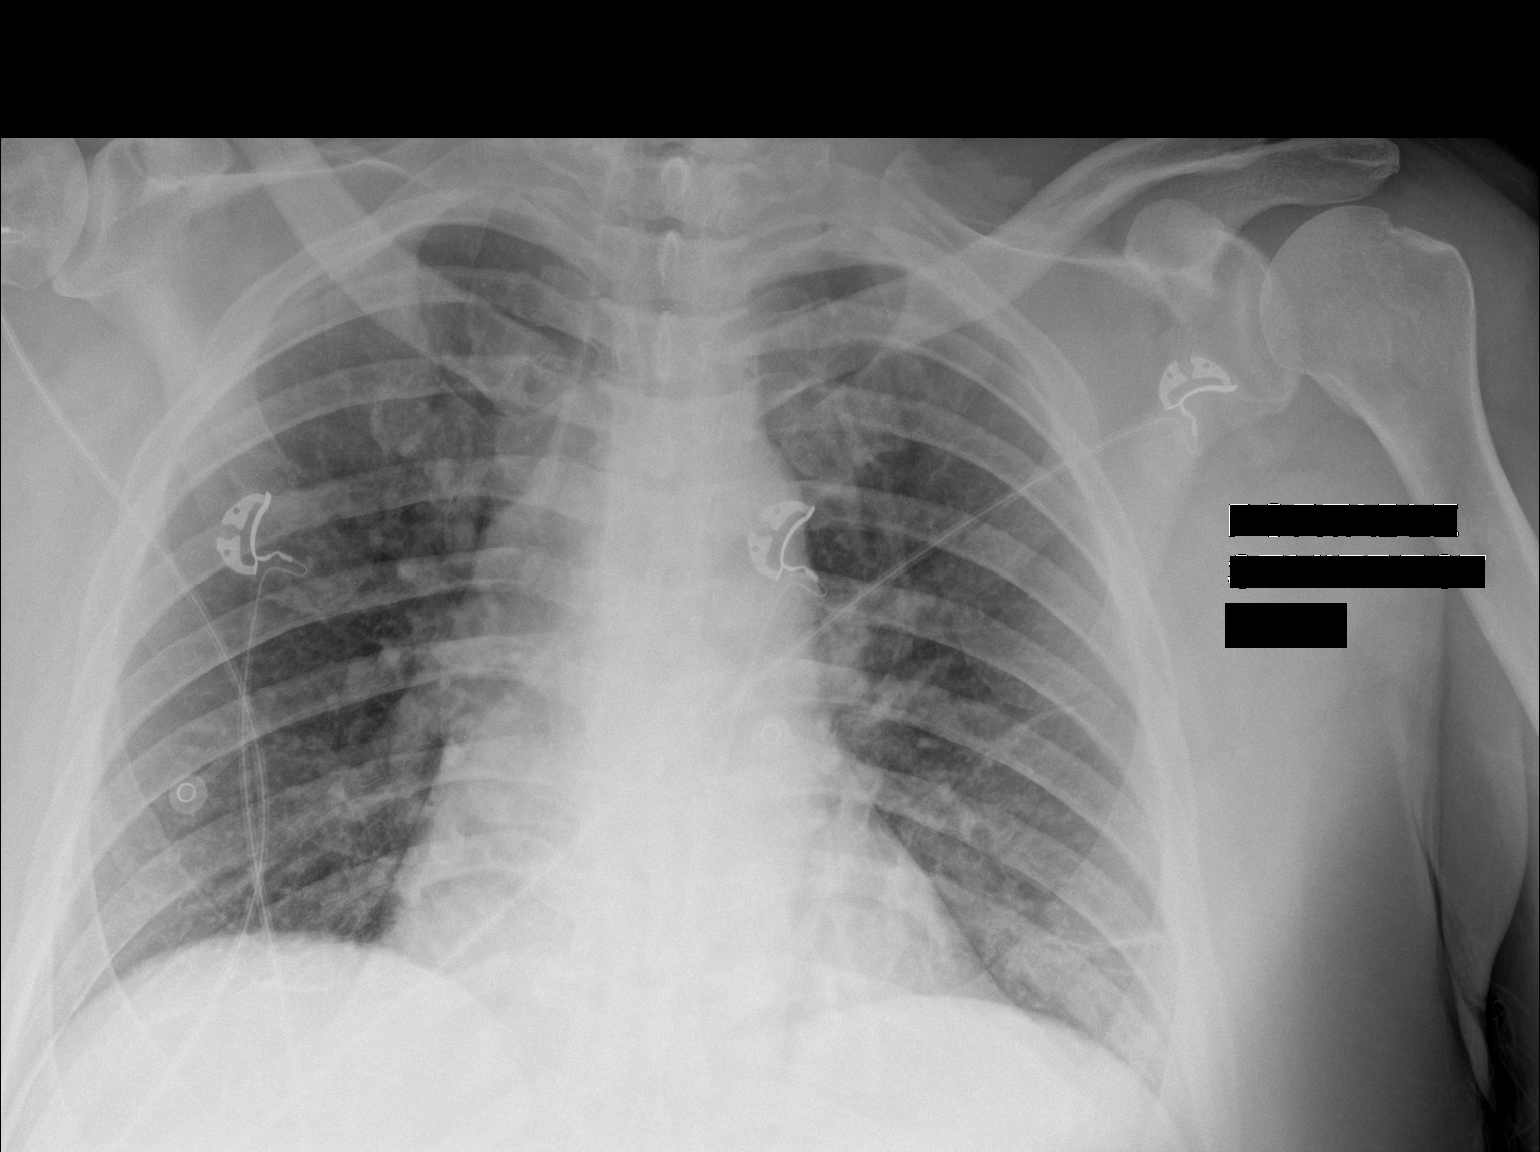

[1 of 1 positions shown; findings below may reference images not displayed]

FINDINGS: Streaky atelectasis left lung base. No pleural effusion or
pneumothorax. Cardiomediastinal silhouette within normal limits.
IMPRESSION: Streaky atelectasis left lung base.  Otherwise negative.

## 2021-09-23 ENCOUNTER — Emergency Department (HOSPITAL_COMMUNITY): Payer: Medicare HMO

## 2021-09-23 ENCOUNTER — Emergency Department (HOSPITAL_COMMUNITY)
Admission: EM | Admit: 2021-09-23 | Discharge: 2021-09-23 | Disposition: A | Discharge status: Home / Self Care | Payer: Medicare HMO | Attending: Emergency Medicine | Admitting: Emergency Medicine

## 2021-09-23 ENCOUNTER — Other Ambulatory Visit: Payer: Self-pay

## 2021-09-23 DIAGNOSIS — X509XXA Other and unspecified overexertion or strenuous movements or postures, initial encounter: Secondary | ICD-10-CM | POA: Diagnosis not present

## 2021-09-23 DIAGNOSIS — R102 Pelvic and perineal pain: Secondary | ICD-10-CM | POA: Diagnosis not present

## 2021-09-23 DIAGNOSIS — M549 Dorsalgia, unspecified: Secondary | ICD-10-CM | POA: Diagnosis present

## 2021-09-23 DIAGNOSIS — M545 Low back pain, unspecified: Secondary | ICD-10-CM | POA: Insufficient documentation

## 2021-09-23 DIAGNOSIS — G8911 Acute pain due to trauma: Secondary | ICD-10-CM | POA: Insufficient documentation

## 2021-09-23 MED ORDER — HYDROCODONE-ACETAMINOPHEN 5-325 MG PO TABS: 1.0000 | ORAL_TABLET | ORAL | 0 refills | Status: AC | PRN

## 2021-09-23 MED ORDER — OXYCODONE-ACETAMINOPHEN 5-325 MG PO TABS
1.0000 | ORAL_TABLET | Freq: Once | ORAL | Status: AC
  Administered 2021-09-23: 1 via ORAL
  Filled 2021-09-23: qty 1

## 2021-09-23 MED ORDER — PREDNISONE 20 MG PO TABS
60.0000 mg | ORAL_TABLET | Freq: Once | ORAL | Status: AC
  Administered 2021-09-23: 60 mg via ORAL
  Filled 2021-09-23: qty 3

## 2021-09-23 MED ORDER — PREDNISONE 10 MG PO TABS: ORAL_TABLET | ORAL | 0 refills | Status: AC

## 2021-09-23 MED ORDER — HYDROCODONE-ACETAMINOPHEN 5-325 MG PO TABS
2.0000 | ORAL_TABLET | Freq: Once | ORAL | Status: AC
  Administered 2021-09-23: 2 via ORAL
  Filled 2021-09-23: qty 2

## 2021-09-23 NOTE — Discharge Instructions (Signed)
Use heat on the sore area 3-4 times a day.  Call your back surgeon for a follow-up appointment to be seen as soon as possible.

## 2021-09-23 NOTE — ED Triage Notes (Signed)
Patient felt a 'pop" while lying on bed this evening and experienced low back pain , denies injury or fall , no urinary discomfort .

## 2021-09-23 NOTE — ED Notes (Signed)
Rn reviewed discharge instructions with pt. Pt verbalized understanding and had no further questions. VSS upon discharge 

## 2021-09-23 NOTE — ED Provider Notes (Signed)
[place] EMERGENCY DEPARTMENT Provider Note   CSN: [id number] Arrival date & time: 09/23/21  1841     History {Add pertinent medical, surgical, social history, OB history to HPI:1} Chief Complaint  Patient presents with   Back Pain    Ethan Rosales is a 61 y.o. male.  HPI Patient presents for evaluation of back pain.  He has had prior back surgery.  He developed back pain after standing at a wedding several days ago and later felt a pop in his back.  He is worried about hardware malfunction.  He states he has pain in the left posterior pelvic region with a numb sensation in his left leg. No prior similar problems.  He is disabled because of injury to the back which required r stabilization of the left SI joint with screws.  This was done locally by Dr. Lynann Bologna.  He states he drove here tonight for evaluation.    Home Medications Prior to Admission medications   Medication Sig Start Date End Date Taking? Authorizing Provider  gabapentin (NEURONTIN) 300 MG capsule Take 300 mg by mouth at bedtime. 03/04/17   [provider]  losartan (COZAAR) 100 MG tablet Take 100 mg by mouth daily. 11/09/18   [provider]  omeprazole (PRILOSEC) 20 MG capsule Take 20 mg by mouth daily.    [provider]  OZEMPIC, 0.25 OR 0.5 MG/DOSE, 2 MG/1.5ML SOPN Inject 0.5 mg into the skin every Friday. 01/06/19   [provider]  rosuvastatin (CRESTOR) 5 MG tablet Take 10 mg by mouth daily.     [provider]  tamsulosin (FLOMAX) 0.4 MG CAPS capsule Take 0.4 mg by mouth daily. 01/06/19   [provider]  traMADol (ULTRAM) 50 MG tablet Take 1 tablet (50 mg total) by mouth every 6 (six) hours as needed for moderate pain. 01/16/19   Henreitta Leber, MD  Travoprost, BAK Free, (TRAVATAN) 0.004 % SOLN ophthalmic solution Place 1 drop into both eyes at bedtime. 11/16/13   [provider]      Allergies    Hydromorphone, Morphine and  related, Tamiflu [oseltamivir], Codeine, and Penicillins    Review of Systems   Review of Systems  Physical Exam Updated Vital Signs BP 130/75   Pulse 70   Temp 98.6 F (37 C) (Oral)   Resp 17   Ht 6\' 3"  (1.905 m)   Wt 113.4 kg   SpO2 99%   BMI 31.25 kg/m  Physical Exam Vitals and nursing note reviewed.  Constitutional:      General: He is in acute distress (He is uncomfortable).     Appearance: He is well-developed. He is diaphoretic. He is not ill-appearing.  HENT:     Head: Normocephalic and atraumatic.     Right Ear: External ear normal.     Left Ear: External ear normal.  Eyes:     Conjunctiva/sclera: Conjunctivae normal.     Pupils: Pupils are equal, round, and reactive to light.  Neck:     Trachea: Phonation normal.  Cardiovascular:     Rate and Rhythm: Normal rate.  Pulmonary:     Effort: Pulmonary effort is normal.  Abdominal:     General: There is no distension.  Musculoskeletal:        General: Normal range of motion.     Cervical back: Normal range of motion and neck supple.     Comments: He guards against movement due to pain in the left posterior SI  region.  Skin:    General: Skin is warm.  Neurological:     Mental Status: He is alert and oriented to person, place, and time.     Cranial Nerves: No cranial nerve deficit.     Motor: No abnormal muscle tone.     Coordination: Coordination normal.  Psychiatric:        Mood and Affect: Mood normal.        Behavior: Behavior normal.        Thought Content: Thought content normal.        Judgment: Judgment normal.    ED Results / Procedures / Treatments   Labs (all labs ordered are listed, but only abnormal results are displayed) Labs Reviewed - No data to display  EKG None  Radiology DG Lumbar Spine Complete  Result Date: 09/23/2021 CLINICAL DATA:  History of surgery with rod implants. Felt pop followed by pain down leg. Concern for hardware. Back pain. EXAM: LUMBAR SPINE - COMPLETE 4+ VIEW  COMPARISON:  MRI lumbar spine 08/10/2017 and lumbar spine radiographs 12/10/2013 FINDINGS: There are 5 non-rib-bearing lumbar-type vertebral bodies. Minimal right L1-2 disc space narrowing on frontal view. Normal sagittal alignment. Vertebral body heights are maintained. No acute fracture is seen. No dislocation. There are 3 transverse fusion bars overlying left sacroiliac joint. IMPRESSION: 1. Status post left sacroiliac joint fusion. No evidence of hardware failure. 2. Minimal right L1-2 disc space narrowing. This is likely similar to prior 08/10/2017 MRI within the limitations of differences in modality. Electronically Signed   By: Yvonne Kendall M.D.   On: 09/23/2021 20:32   DG Hip Unilat W or Wo Pelvis 2-3 Views Left  Result Date: 09/23/2021 CLINICAL DATA:  Status post left sacroiliac joint fusion. Felt a pop followed by lower back pain extending down leg. EXAM: DG HIP (WITH OR WITHOUT PELVIS) 2-3V LEFT COMPARISON:  None Available. FINDINGS: Normal bone mineralization. Three horizontal fusion bars overlie the left sacroiliac joint. No perihardware lucency to indicate hardware failure or loosening. Mild bilateral superolateral femoroacetabular joint space narrowing and acetabular degenerative osteophytosis. No acute fracture is seen. No dislocation. IMPRESSION: 1. Status post left sacroiliac joint fusion without evidence of hardware failure. 2. Mild bilateral femoroacetabular osteoarthritis. Electronically Signed   By: Yvonne Kendall M.D.   On: 09/23/2021 20:33    Procedures Procedures  {Document cardiac monitor, telemetry assessment procedure when appropriate:1}  Medications Ordered in ED Medications  oxyCODONE-acetaminophen (PERCOCET/ROXICET) 5-325 MG per tablet 1 tablet (1 tablet Oral Given 09/23/21 1950)    ED Course/ Medical Decision Making/ A&P                           Medical Decision Making    {Document critical care time when appropriate:1} {Document review of labs and clinical  decision tools ie heart score, Chads2Vasc2 etc:1}  {Document your independent review of radiology images, and any outside records:1} {Document your discussion with family members, caretakers, and with consultants:1} {Document social determinants of health affecting pt's care:1} {Document your decision making why or why not admission, treatments were needed:1} Final Clinical Impression(s) / ED Diagnoses Final diagnoses:  None    Rx / DC Orders ED Discharge Orders     None

## 2021-09-23 NOTE — ED Provider Triage Note (Signed)
Emergency Medicine Provider Triage Evaluation Note  Deon Hacking , a 61 y.o. male  was evaluated in triage.  Pt complains of back pain. The patient has a h/o "rods in his SI from a work injury". Reports he was standing at a wedding for 6 hours two days ago. Tonight, he reports he was laying in bed and felt a pop and has been having pain since. Reports burning pain down in leg and "dull stabbing pain pushing in my spne". Denies any urinary or fecal incontinence. Pain is mainly on the left side  Review of Systems  Positive:  Negative:   Physical Exam  BP 137/87 (BP Location: Left Arm)   Pulse 72   Temp 98.6 F (37 C) (Oral)   Resp 16   SpO2 99%  Gen:   Awake, no distress   Resp:  Normal effort  MSK:   Moves extremities without difficulty  Other:  Patient can wiggles toes on left side but will not move leg 2/2 pain.   Medical Decision Making  Medically screening exam initiated at 7:36 PM.  Appropriate orders placed.  Roark Mcmeans was informed that the remainder of the evaluation will be completed by another provider, this initial triage assessment does not replace that evaluation, and the importance of remaining in the ED until their evaluation is complete.  Will order DG lumbar and pelvis    Sherrell Puller, PA-C 09/23/21 1941

## 2021-09-27 ENCOUNTER — Other Ambulatory Visit: Payer: Self-pay | Admitting: Orthopedic Surgery

## 2021-09-27 DIAGNOSIS — M545 Low back pain, unspecified: Secondary | ICD-10-CM

## 2021-09-29 ENCOUNTER — Ambulatory Visit
Admission: RE | Admit: 2021-09-29 | Discharge: 2021-09-29 | Disposition: A | Discharge status: Home / Self Care | Payer: Medicare HMO | Source: Ambulatory Visit | Attending: Orthopedic Surgery | Admitting: Orthopedic Surgery

## 2021-09-29 DIAGNOSIS — M545 Low back pain, unspecified: Secondary | ICD-10-CM

## 2022-12-07 ENCOUNTER — Emergency Department (HOSPITAL_BASED_OUTPATIENT_CLINIC_OR_DEPARTMENT_OTHER)
Admission: EM | Admit: 2022-12-07 | Discharge: 2022-12-07 | Disposition: A | Discharge status: Home / Self Care | Payer: Medicare HMO | Attending: Emergency Medicine | Admitting: Emergency Medicine

## 2022-12-07 ENCOUNTER — Other Ambulatory Visit: Payer: Self-pay

## 2022-12-07 ENCOUNTER — Encounter (HOSPITAL_BASED_OUTPATIENT_CLINIC_OR_DEPARTMENT_OTHER): Payer: Self-pay

## 2022-12-07 ENCOUNTER — Emergency Department (HOSPITAL_BASED_OUTPATIENT_CLINIC_OR_DEPARTMENT_OTHER): Payer: Medicare HMO | Admitting: Radiology

## 2022-12-07 DIAGNOSIS — S76012A Strain of muscle, fascia and tendon of left hip, initial encounter: Secondary | ICD-10-CM | POA: Insufficient documentation

## 2022-12-07 DIAGNOSIS — M545 Low back pain, unspecified: Secondary | ICD-10-CM | POA: Insufficient documentation

## 2022-12-07 DIAGNOSIS — S46012A Strain of muscle(s) and tendon(s) of the rotator cuff of left shoulder, initial encounter: Secondary | ICD-10-CM | POA: Diagnosis not present

## 2022-12-07 DIAGNOSIS — S79912A Unspecified injury of left hip, initial encounter: Secondary | ICD-10-CM | POA: Diagnosis present

## 2022-12-07 DIAGNOSIS — Y92410 Unspecified street and highway as the place of occurrence of the external cause: Secondary | ICD-10-CM | POA: Diagnosis not present

## 2022-12-07 DIAGNOSIS — T148XXA Other injury of unspecified body region, initial encounter: Secondary | ICD-10-CM

## 2022-12-07 MED ORDER — CYCLOBENZAPRINE HCL 7.5 MG PO TABS: 7.5000 mg | ORAL_TABLET | Freq: Two times a day (BID) | ORAL | 0 refills | Status: AC | PRN

## 2022-12-07 MED ORDER — CYCLOBENZAPRINE HCL 5 MG PO TABS
5.0000 mg | ORAL_TABLET | Freq: Once | ORAL | Status: AC
  Administered 2022-12-07: 5 mg via ORAL
  Filled 2022-12-07: qty 1

## 2022-12-07 NOTE — ED Notes (Signed)
Pt. C/o pain multiple sites, but is most concerned with left hip. Also c/o pain in neck and shoulder

## 2022-12-07 NOTE — Discharge Instructions (Addendum)
You have been seen and discharged from the emergency department.  X-ray imaging was negative for fracture.  You are suffering from muscle strain/spasm.  You are given a dose of muscle relaxer here in the department.  You may take Advil/ibuprofen every 6-8 hours for pain/inflammation relief.  Take muscle relaxer as directed and when needed.  Do not mix this medication with alcohol or other sedating medications. Do not drive or do heavy physical activity until you know how this medication affects you.  It may cause drowsiness.  You may also ice and heat the muscles for relief.  Follow-up with your primary provider for further evaluation and further care. Take home medications as prescribed. If you have any worsening symptoms or further concerns for your health please return to an emergency department for further evaluation.
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
## 2023-08-19 ENCOUNTER — Encounter: Payer: Self-pay | Admitting: Orthopaedic Surgery

## 2023-08-19 ENCOUNTER — Other Ambulatory Visit: Payer: Self-pay

## 2023-08-19 ENCOUNTER — Ambulatory Visit: Admitting: Orthopaedic Surgery

## 2023-08-19 ENCOUNTER — Other Ambulatory Visit (INDEPENDENT_AMBULATORY_CARE_PROVIDER_SITE_OTHER): Payer: Self-pay

## 2023-08-19 VITALS — Ht 75.0 in | Wt 279.0 lb

## 2023-08-19 DIAGNOSIS — M25552 Pain in left hip: Secondary | ICD-10-CM
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
## 2023-08-20 ENCOUNTER — Encounter: Payer: Self-pay | Admitting: Orthopaedic Surgery

## 2023-08-30 ENCOUNTER — Ambulatory Visit
Admission: RE | Admit: 2023-08-30 | Discharge: 2023-08-30 | Disposition: A | Discharge status: Home / Self Care | Source: Ambulatory Visit | Attending: Orthopaedic Surgery | Admitting: Orthopaedic Surgery

## 2023-08-30 DIAGNOSIS — M25552 Pain in left hip: Secondary | ICD-10-CM

## 2023-10-05 ENCOUNTER — Ambulatory Visit: Admitting: Orthopaedic Surgery

## 2023-10-05 ENCOUNTER — Encounter: Payer: Self-pay | Admitting: Orthopaedic Surgery

## 2023-10-05 DIAGNOSIS — M25552 Pain in left hip: Secondary | ICD-10-CM
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
## 2023-10-08 NOTE — Progress Notes (Signed)
-------------------------------------------------------------------------------   Attestation signed by Windsor Hurl at 10/08/23 1358 The patient outreach was conducted by the Pharmacologist. I was accessible, if needed, at the time of the outreach.  I have reviewed and agree with the note provided above, which was prepared by the Certified Pharmacy Technician.   Hurl Windsor, PharmD  October 08, 2023 1:58 PM.    -------------------------------------------------------------------------------  [place] [facility] ESCALATE TO PHARMACIST   Wilfrido TYSON PARKISON Sr is a 63 y.o. male identified as possibly having a proportion of days covered of less than 80%  for the DM - Diabetes medication adherence quality measure(s), based on payer reports and chart review.  After reviewing the chart and pharmacy claims regarding the following medication(s),  the patient's outreach has been escalated to Pharmacist in charge due to the following reason(s):   Possible side effects   Population: AETNA-MA  Clinical Medication Adherence Assessment    Medication Assessed #1: OZEMPIC 2MG /3ML  Chronic Disease State: Diabetes  Source of Data/Request for Medication Adherence Assessment: Payer Report  Does patient indicate they are taking their medication as prescribed?: Yes  Are prescription instructions in line with the way patient is currently taking this medication?: Yes  Medication Refills Remaining?: Yes  Date of Last Refill: 08/11/23  Do you currently receive a 90-day supply of this medication?: No  Would you like for us  to speak to your provider about a 90-day supply?: No  In general do you have any trouble obtaining or affording your medications?: No  Does the patient have any perceived side effects from this medication?: Yes  Current side effects: Severe stomach cramps  Would you like to discuss your side effects further with our pharmacist?: Yes      What other  reasons can you think of that may have caused missed doses or gaps in medication fill history?: Currently have extra supply of medication due to dosage change  Reasons for Non-Adherence: Side Effects, Other  Patient stated that he is experiencing sever stomach cramps when taking Ozempic. Patient reported that his provider has lowered his dose to 0.25mg  once per week but the cramps are not subsiding. Patient stated the cramps usually last for 2 to 3 days after the injection. Patients next appointment with Dr Babara is on 10/23/2023 and patient stated that he was instructed to continue taking the lower dose until his appointment on the 27th. The patient was offered a call back from a [facility] Pharmacist and he stated that he would like to speak to a CPP regarding his options. Contact will be escalated to a [facility] Pharmacist for follow up.  Actions taken for this medication: Education Provided, Pharmacist Appointment Recommended    General Medication Adherence: :  Population: Dance movement psychotherapist Source: Patient  Who helps you with your medications?: Manage Myself  Thinking about all of your medications, do you have any issues or concerns that we can further assist you with?: Yes  Assistance needed with: Cramping after Ozempic injection    Would you be interested in discussing your medication concerns further through a scheduled virtual visit with a pharmacist?: Yes  Interventions Taken: Pharmacist Visit Scheduled        Additional Details & Actions Taken:  None   Jayson Pouch , CPhT Certified Pharmacy Technician [place] [facility] Phone Numbers: [phone]

## 2023-10-08 NOTE — Progress Notes (Signed)
-------------------------------------------------------------------------------   Attestation signed by Jeneal [name] Clause, CPP at 10/09/23 [phone] I have reviewed and agree with the note provided above, which was prepared by Tillman Allan, PharmD.  [name] [name] Jeneal, CPP, PharmD  October 09, 2023 8:28 AM.    -------------------------------------------------------------------------------  [place] [facility] -- Medication Adherence PHARMACIST OUTREACH  Name: Ethan Rosales Date of Birth: 05-22-1960 MRN: [id number]  To patients reading this note: Please be advised that the primary purpose of this note is for us  to keep track of your care and communicate with other members of your medical team. These suggested changes are intended to enhance the overall care you receive.  Approved changes will be communicated to you by your care team.    Called pt to discuss medication questions. Stomach cramping on ozempic 0.25mg    The patient reports restarting ozempic at 0.5mg  weekly in April, but was instructed by his provider to reduce the dose to 0.25 due to severe stomach cramping and nausea. He reports giving 3 injections at this lower dose, but still experiences cramping and loss of appetite x 2-3 days post-injection. He states it is less severe but still present. I discussed how the medication works and that it would reduce his appetite. I reviewed the patients diet and instructed the patient to continue to avoid spicy/high fat foods, limit acidic food and beverages, eat smaller meals more frequently, and to space out his injection from meals. At this time, he will continue semaglutide 0.25 mg.  He also reports urinating more frequently after eating watermelon (7x during the night), but he will follow-up with his PCP.   Tillman Allan , PharmD [place] [facility] ([facility] Phone: [phone]

## 2023-10-23 NOTE — Progress Notes (Signed)
 [facility] Dr. Rubye Cap   Assessment/ Plan:  Assessment & Plan Primary hypertension BP Readings from Last 3 Encounters:  10/23/23 142/92  08/07/23 133/89  06/12/23 162/95  Hospitalized Feb 2025 for chest pain and was seen by cardiology. BP not at goal.  Daily headaches likely related to elevated blood pressure. Out of carvedilol, only taking losartan  50mg  BID. Simplifying regimen to once daily dosing considered for adherence. Cardiologist recommended continuing carvedilol. - Change to Losartan  100mg  daily - Continue Carvedilol 6.25mg  BID- refilled  - Encouraged to schedule a follow-up appointment w Cardiology  - advised to bring all pill bottles to each visit for review.   - working on reducing salt in diet Orders: .  losartan  (COZAAR ) 100 MG tablet; Take 1 tablet (100 mg total) by mouth daily. .  carvedilol (COREG) 6.25 MG tablet; Take 1 tablet (6.25 mg total) by mouth in the morning and 1 tablet (6.25 mg total) in the evening. Take with meals. [name]  amlodipine (NORVASC) 5 MG tablet; Take 1 tablet (5 mg total) by mouth daily.   Type 2 diabetes mellitus with stage 1 chronic kidney disease, without long-term current use of insulin    Lab Results  Component Value Date   A1C 6.8 (H) 06/02/2023  Cannot tolerate metformin  due to severe nausea Frustrated about weight gain in spite of eating about 2 meals a day, working with nutritionist On Ozempic 0.5 mg weekly. Experiences cramping on administration day but no missed doses. Hesitant to increase to 1 mg due to tolerability concerns. Consulted with nutritionist for dietary adjustments. - Continue Ozempic 0.5 mg weekly. - Discuss potential side effects of Ozempic, including cramping. - Ensured he has refills of Ozempic 0.5 mg  Orders: .  semaglutide (OZEMPIC) 0.25 mg or 0.5 mg (2 mg/3 mL) PnIj; Inject 0.5 mg under the skin every seven (7) days.   Drug induced constipation Since starting ozempic, notes BM's  are every 3 days.  Using miralax 2-3 times per week Agreed to trial of Linzess - will start low dose 72mcg Orders: .  linaclotide (LINZESS) 72 mcg capsule; Take 1 capsule (72 mcg total) by mouth daily.  OSA (obstructive sleep apnea) Off CPAP for some time at least 2-3 years.  Poor sleep averaging 3.5 hours per night may contribute to elevated blood pressure. Not using CPAP machine. Resuming CPAP therapy could help lower blood pressure. - Attend upcoming sleep clinic appointment. - Resume CPAP therapy.     Impacted cerumen of left ear ar pain and difficulty hearing due to ear wax impaction. Significant wax removal improved hearing and relieved symptoms. - Ear irrigation performed successfully in clinic today resolution of symptoms.  - Advise against using Q-tips in the ears.    Healthcare maintenance Motivated to improve health. Engages in physical activity and consults with nutritionist for dietary adjustments. - Continue consultation with nutritionist for dietary advice. - Encourage regular physical activity.  - discussed vaccines due as below-- opted to defer for future visit.       Preventive Care Health Maintenance Due  Topic Date Due  . Pneumococcal Vaccine 50+ (2 of 2 - PCV) 04/23/2019  . Zoster Vaccines (2 of 2) 12/11/2021  . COVID-19 Vaccine (3 - 2024-25 season) 12/28/2022    Medication adherence and barriers to the treatment plan have been addressed. Opportunities to optimize healthy behaviors have been discussed. Patient / caregiver voiced understanding.  RETURN TO CARE: Future Appointments  Date Time Provider Department Center  11/06/2023  2:45 PM Angelena Pastor, [place] [facility]  12/14/2023  1:45 PM Loletha Dallas Counts, RD/LDN [facility]  12/25/2023  4:50 PM Babara Rubye Finder, MD [facility]  02/09/2024  1:30 PM Carlyon Rollo Caldron, PA [facility]       _____________________________________________________________________  Subjective: CC:  Chief Complaint  Patient presents with  . Follow-up    Blood pressure   . Otalgia    Pain in L ear when yawning    Patient Care Team: Babara Rubye Finder, MD as PCP - General (Family Medicine) Curly Ophelia Fenton, RD/LDN as Dietitian (Dietitian) Carlyon Rollo Caldron, PA as Physician Assistant (Gastroenterology)  HPI:  History of Present Illness   Ethan Rosales is a 63 year old male with hypertension and diabetes who presents for medication management and follow-up.  He has run out of carvedilol and is currently taking losartan  50 mg twice daily and carvedilol 6.25 mg twice daily. His regimen also includes omeprazole, eye drops for glaucoma, nasal spray, and Ozempic 0.5 mg once a week. He experiences daily headaches and ear pain when yawning. Cramping occurs on the day he takes Ozempic, which is every Sunday. He has been on the current dose of Ozempic for two weeks without missing any doses.  He averages about three and a half hours of sleep per night, which he believes affects his blood pressure. He lives alone in a two-bedroom apartment in [place] and drives to his appointments. He denies recent cold or infection but is concerned about ear wax buildup affecting his hearing.  He denies smoking or using marijuana. He has attempted dietary changes with the help of a nutritionist.  Continues care with outside orthopedist Shoulder surgery planned for  10/15 Spinal ESI's also scheduled      For details, please see A/P  BP Readings from Last 3 Encounters:  10/23/23 142/92  08/07/23 133/89  06/12/23 162/95    Wt Readings from Last 3 Encounters:  10/23/23 (!) 125.2 kg (276 lb)  10/19/23 (!) 128.4 kg (283 lb 1.1 oz)  09/29/23 (!) 128.4 kg (283 lb 1.1 oz)     Medications:  Current Medications[1]    I have reviewed the patient's problem list, current medications,  allergies, and social history and updated them as needed.   Objective: PE: BP 142/92 Comment: average  Pulse 65   Temp 36.5 C (97.7 F) (Temporal)   Ht 191.8 cm (6' 3.51)   Wt (!) 125.2 kg (276 lb)   BMI 34.03 kg/m  Physical Exam Constitutional:      Comments: Tired-appearing  HENT:     Right Ear: Ear canal normal.     Left Ear: There is impacted cerumen.   Eyes:     Conjunctiva/sclera:     Right eye: Right conjunctiva is injected.     Left eye: Left conjunctiva is injected.    Cardiovascular:     Rate and Rhythm: Normal rate and regular rhythm.     Heart sounds: No murmur heard.  Neurological:     Mental Status: He is alert.          [1]  Current Outpatient Medications:  .  ammonium lactate (AMLACTIN) 12 % cream, Apply 1 Application topically two (2) times a day., Disp: 385 g, Rfl: 11 .  cyclobenzaprine  (FEXMID ) 7.5 MG tablet, Take 1 tablet (7.5 mg total) by mouth., Disp: , Rfl:  .  ipratropium (ATROVENT) 21 mcg (0.03 %) nasal spray, 1 spray into each  nostril Three (3) times a day as needed for rhinitis., Disp: 30 mL, Rfl: 1 .  meclizine (ANTIVERT) 12.5 mg tablet, Take 1 tablet (12.5 mg total) by mouth Three (3) times a day as needed for dizziness or nausea., Disp: 30 tablet, Rfl: 1 .  nitroglycerin  (NITROSTAT ) 0.4 MG SL tablet, Place 1 tablet (0.4 mg total) under the tongue every five (5) minutes as needed for chest pain. Maximum of 3 doses in 15 minutes., Disp: 100 tablet, Rfl: 3 .  omeprazole (PRILOSEC) 40 MG capsule, Take 1 capsule (40 mg total) by mouth Two (2) times a day (30 minutes before a meal)., Disp: 180 capsule, Rfl: 3 .  rosuvastatin  (CRESTOR ) 20 MG tablet, Take 1 tablet (20 mg total) by mouth nightly., Disp: 90 tablet, Rfl: 3 .  travoprost 0.004 % Drop, Administer 1 drop to both eyes every evening., Disp: , Rfl:  .  amlodipine (NORVASC) 5 MG tablet, Take 1 tablet (5 mg total) by mouth daily., Disp: 30 tablet, Rfl: 11 .  carvedilol (COREG) 6.25 MG  tablet, Take 1 tablet (6.25 mg total) by mouth in the morning and 1 tablet (6.25 mg total) in the evening. Take with meals., Disp: 180 tablet, Rfl: 0 .  linaclotide (LINZESS) 72 mcg capsule, Take 1 capsule (72 mcg total) by mouth daily., Disp: 90 capsule, Rfl: 3 .  losartan  (COZAAR ) 100 MG tablet, Take 1 tablet (100 mg total) by mouth daily., Disp: 90 tablet, Rfl: 3 .  semaglutide (OZEMPIC) 0.25 mg or 0.5 mg (2 mg/3 mL) PnIj, Inject 0.5 mg under the skin every seven (7) days., Disp: 3 mL, Rfl: 3

## 2023-12-25 NOTE — Progress Notes (Signed)
 [facility] Dr. Rubye Cap   Assessment/ Plan:  Assessment & Plan Primary hypertension BP Readings from Last 3 Encounters:  12/25/23 133/79  10/23/23 142/92  08/07/23 133/89  Hospitalized Feb 2025 for chest pain and was seen by cardiology. BP improved, reviewed meds today, adherent.  Cardiologist recommended continuing carvedilol for PVCs - continue Losartan  100mg  daily, amlodipine 5mg  and Carvedilol 6.25mg  BID - Encouraged to schedule a follow-up appointment w Cardiology  - advised to bring all pill bottles to each visit for review.   - reassured mild LE edema likely related to amlodipine  - cont rosuvastatin  20mg  -consider dose increase to get LDL < 70 Orders: .  Basic Metabolic Panel; Future .  Lipid Panel; Future   Testicular mass Small, irregular mass at the base of the right testicle, question varicocele or cyst?  - Order scrotal ultrasound to evaluate the mass. Orders: .  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 Previous adverse reaction to Ozempic (severe abdominal cramps) and intolerance to metformin . Discussed alternative treatment options including Mounjaro, which may be better tolerated and also reduces cardiovascular risk. Concern about potential stomach cramps with new medication. - Prescribe Mounjaro (tirzepatide) and send prescription to [facility] pharmacy for processing.  Has the patient participated in a comprehensive weight management program for at least 6 months that encourages behavioral modification, reduced calorie diet, and increased physical activity? Yes  Will the requested medication be used concurrently with another GLP-1 receptor agonist medication? No  Will the requested medication will be used concurrently with another weight  loss medication?  No  Has the patient tried and failed, or has a contraindication / intolerance to any additional weight loss medications?  If so, which med(s) and what was reason for stopping?  Yes. - Ozempic caused severe abdominal cramps  Will the requested medication be used concurrently with insulin ?  No  If already on a GLP-1 agonist for weight loss (eg Wegovy, Saxenda), has patient lost at least 5% of their body weight since initiation of GLP-1 therapy?  No - NA  Has the patient tried Metformin ? Yes - cannot tolerate  Have they seen a Nutritionist? Yes  Have they had a A1c 6.5 or higher at some point? Yes - see above  Does the beneficiary have at least one weight related comorbidity/ risk factor/ complication (HTN, type 2 DM, OSA, CVD, dyslipidemia)? Yes - HTN, OSA, dyslipidemia, T2DM   Does the beneficiary have a history of MI, stroke, PAD?  Yes - prior stroke/CVA  Is the beneficiary currently on and will the beneficiary continue lifestyle modification including structured nutrition and physical activity at the time the GLP1 therapy commences? Yes  Patients Baseline Weight: 277 lbs Patients Baseline BMI: 34  Patient Age, BMI, and Weights:  63 y.o. BMI Readings from Last 3 Encounters:  12/25/23 34.15 kg/m  10/23/23 34.03 kg/m  10/19/23 34.90 kg/m   Last 6 weights:        12/25/23 (!) 125.6 kg (277 lb)  10/23/23 (!) 125.2 kg (276 lb)  10/19/23 (!) 128.4 kg (283 lb 1.1 oz)  09/29/23 (!) 128.4 kg (283 lb 1.1 oz)  08/07/23 (!) 128.4 kg (283 lb)  06/12/23 (!) 127 kg (280 lb)    Weight Change Past 6 Months in Pounds (  rounded): -6 at 01/04/2024 11:54 AM     Orders: .  tirzepatide (MOUNJARO) 2.5 mg/0.5 mL PnIj; Inject 0.5 mL (2.5 mg total) under the skin every seven (7) days for 4 doses. .  tirzepatide (MOUNJARO) 5 mg/0.5 mL PnIj pen; Inject 0.5 mL (5 mg total) under the skin every seven (7) days for 4 doses.   Preventive Care Health Maintenance Due  Topic Date Due   . Pneumococcal Vaccine 50+ (2 of 2 - PCV) 04/23/2019  . Zoster Vaccines (2 of 2) 12/11/2021  . DTaP/Tdap/Td Vaccines (2 - Td or Tdap) 11/19/2023  . COVID-19 Vaccine (3 - 2025-26 season) 12/28/2023  . Influenza Vaccine (1) 12/28/2023    Medication adherence and barriers to the treatment plan have been addressed. Opportunities to optimize healthy behaviors have been discussed. Patient / caregiver voiced understanding.  RETURN TO CARE:  Future Appointments  Date Time Provider Department Center  01/04/2024  3:15 PM Loletha Dallas Counts, RD/LDN [facility]  01/11/2024  2:45 PM [facility] US  RM 1 [facility]  01/29/2024  8:55 AM [place] Geofm CROME, FNP [facility]  02/09/2024  1:30 PM Carlyon Rollo Caldron, PA [facility]  03/03/2024  5:00 PM Babara Rubye Finder, MD [facility]      _____________________________________________________________________  Subjective: CC:  Chief Complaint  Patient presents with  . Follow-up    Patient Care Team: Babara Rubye Finder, MD as PCP - General (Family Medicine) [place], Lamia Chaoukat, RD/LDN as Dietitian (Dietitian) Carlyon Rollo Caldron, PA as Physician Assistant (Gastroenterology)  HPI:  History of Present Illness   Ethan Rosales is a 63 year old male with diabetes and hypertension who presents with gastrointestinal side effects from Ozempic and a concern about a testicular lump.  He experiences severe stomach cramps for three to four days after taking Ozempic, even at a reduced dose of 0.25 mg. He last took Ozempic on the previous Sunday. Linzess 72 mcg is taken for constipation, which is effective but causes an urgent need to defecate. Bowel movements are not daily but have improved.  A small, persistent lump under his testicle has been present for about three years. It resembles a grain of rice or lentil and is located on the vas deferens. There is a family history of prostate cancer, including his  father.  He takes amlodipine and losartan  for hypertension and carvedilol for heart palpitations. He takes two amlodipine pills once a day and experiences mild swelling. His recent A1c is 7.6%, and he is not on metformin  due to intolerance, exploring other options. Family history includes prostate cancer in his father, brother, uncle, and cousin.       For details, please see A/P  BP Readings from Last 3 Encounters:  12/25/23 133/79  10/23/23 142/92  08/07/23 133/89    Wt Readings from Last 3 Encounters:  12/25/23 (!) 125.6 kg (277 lb)  10/23/23 (!) 125.2 kg (276 lb)  10/19/23 (!) 128.4 kg (283 lb 1.1 oz)     Medications:  Current Medications[1]    I have reviewed the patient's problem list, current medications, allergies, and social history and updated them as needed.   Objective: PE: BP 133/79 Comment: avg BP  Pulse 64   Temp 36.1 C (97 F) (Temporal)   Ht 191.8 cm (6' 3.51)   Wt (!) 125.6 kg (277 lb)   BMI 34.15 kg/m  Physical Exam       [1]  Current Outpatient Medications:  .  amlodipine (NORVASC) 5 MG tablet, Take 1 tablet (5 mg total) by mouth daily., Disp: 30 tablet, Rfl: 11 .  ammonium lactate (AMLACTIN) 12 % cream, Apply 1 Application topically two (2) times a day., Disp: 385 g, Rfl: 11 .  carvedilol (COREG) 6.25 MG tablet, Take 1 tablet (6.25 mg total) by mouth in the morning and 1 tablet (6.25 mg total) in the evening. Take with meals., Disp: 180 tablet, Rfl: 0 .  cyclobenzaprine  (FEXMID ) 7.5 MG tablet, Take 1 tablet (7.5 mg total) by mouth., Disp: , Rfl:  .  ipratropium (ATROVENT) 21 mcg (0.03 %) nasal spray, 1 spray into each nostril Three (3) times a day as needed for rhinitis., Disp: 30 mL, Rfl: 1 .  linaclotide (LINZESS) 72 mcg capsule, Take 1 capsule (72 mcg total) by mouth daily., Disp: 90 capsule, Rfl: 3 .  losartan  (COZAAR ) 100 MG tablet, Take 1 tablet (100 mg total) by mouth daily., Disp: 90 tablet, Rfl: 3 .  meclizine (ANTIVERT) 12.5 mg  tablet, Take 1 tablet (12.5 mg total) by mouth Three (3) times a day as needed for dizziness or nausea., Disp: 30 tablet, Rfl: 1 .  nitroglycerin  (NITROSTAT ) 0.4 MG SL tablet, Place 1 tablet (0.4 mg total) under the tongue every five (5) minutes as needed for chest pain. Maximum of 3 doses in 15 minutes., Disp: 100 tablet, Rfl: 3 .  omeprazole (PRILOSEC) 40 MG capsule, Take 1 capsule (40 mg total) by mouth Two (2) times a day (30 minutes before a meal)., Disp: 180 capsule, Rfl: 3 .  rosuvastatin  (CRESTOR ) 20 MG tablet, Take 1 tablet (20 mg total) by mouth nightly., Disp: 90 tablet, Rfl: 3 .  travoprost 0.004 % Drop, Administer 1 drop to both eyes every evening., Disp: , Rfl:  .  tirzepatide (MOUNJARO) 2.5 mg/0.5 mL PnIj, Inject 0.5 mL (2.5 mg total) under the skin every seven (7) days for 4 doses., Disp: 2 mL, Rfl: 0 .  [START ON 02/01/2024] tirzepatide (MOUNJARO) 5 mg/0.5 mL PnIj pen, Inject 0.5 mL (5 mg total) under the skin every seven (7) days for 4 doses., Disp: 2 mL, Rfl: 0

## 2024-01-04 NOTE — Progress Notes (Signed)
 [facility] Outpatient Nutrition Services  Medical Nutrition Therapy Consultation    Visit Type:    Return Assessment  Referral Reason:   E11.22,N18.1 (ICD-10-CM) - Type 2 diabetes mellitus with stage 1 chronic kidney disease, without long-term current use of insulin     (CMS-HCC)    Ethan Rosales is a 63 y.o. male seen for medical nutrition therapy. His active problem list, medication list, allergies, notes from last encounter, and lab results were reviewed.   Past Medical History[1]  Anthropometrics  Height: 191.8 cm (6' 3.51)  Weight: (!) 125.6 kg (276 lb 14.4 oz) Body mass index is 34.14 kg/m.  Wt Readings from Last 5 Encounters:  01/04/24 (!) 125.6 kg (276 lb 14.4 oz)  12/25/23 (!) 125.6 kg (277 lb)  10/23/23 (!) 125.2 kg (276 lb)  10/19/23 (!) 128.4 kg (283 lb 1.1 oz)  09/29/23 (!) 128.4 kg (283 lb 1.1 oz)     Usual body weight: -2.8 kg (2.2%) since 10/19/23 (2 months, 16 days) per documentation. He says that his weight has been fluctuating. He said it got down as low as 264 lb. He would like to weigh 220-225 lb.   Ideal Body Weight: 90.34 kg Weight in (lb) to have BMI = 25: 202.3  Nutrition Risk Screening:  Food Insecurity: Food Insecurity Present (11/05/2020)   Hunger Vital Sign   . Worried About Programme researcher, broadcasting/film/video in the Last Year: Sometimes true   . Ran Out of Food in the Last Year: Sometimes true     Nutrition Focused Physical Exam:               Nutrition Evaluation Overall Impressions: Nutrition-Focused Physical Exam not indicated due to lack of malnutrition risk factors. (01/04/24 1604)   Malnutrition Screening:  Patient does not meet AND/ASPEN criteria for malnutrition at this time (01/04/24 1605)  Biochemical Data, Medical Tests and Procedures: All pertinent labs and imaging reviewed by Dallas JONELLE Nasuti, RD/LDN at 1:00 PM 01/04/2024.  Component     Latest Ref Rng 12/25/2023  Sodium     135 - 145 mmol/L 140   Potassium     3.4 -  4.8 mmol/L 3.5   Chloride     98 - 107 mmol/L 100   CO2     20.0 - 31.0 mmol/L 28.0   Anion Gap     5 - 14 mmol/L 12   Bun     9 - 23 mg/dL 12   Creatinine     9.26 - 1.18 mg/dL 8.69 (H)   BUN/Creatinine Ratio 9   eGFR CKD-EPI (2021) Male     >=60 mL/min/1.51m2 63   Glucose     70 - 179 mg/dL 807 (H)   Calcium      8.7 - 10.4 mg/dL 9.7   Cholesterol, Total     <200 mg/dL 847   HDL     >59 mg/dL 49   LDL calculated     <100 mg/dL 81   Non-HDL Cholesterol     <130 mg/dL 896   Triglycerides     <150 mg/dL 855   Fasting No   HGB A1C, RAP/PDS     <7.0 % 7.6 (H)   EST AVG GLU/PDS     mg/dL 828     Legend: (H) High  Medications and Vitamin/Mineral Supplementation:  All nutritionally pertinent medications reviewed on 01/04/2024.  Nutritionally pertinent medications include: Prilosec, Crestor . Dr. Babara wrote a prescription for Mounjaro but he hasn't received it yet.  He is not taking Ozempic anymore.  He is taking nutrition supplements: Multivitamins make him feel sick. Occasionally doing ginseng berry tea.   Current Medications[2]  Nutrition History:  Dietary Restrictions: Heartburn after acidic foods and lactose intolerance.   Gastrointestinal Issues: He said that he is still taking the Linzess daily to have a bowel movement. He said that he was taking omeprazole twice daily but his MD advised him to decrease it to once daily, he is having symptoms with one pill.   Hunger and Satiety: Not a strong appetite. Says that it was poor before Ozempic but Ozempic made it worse.   Food Safety and Access: Not on SNAP anymore. Gets a lot of fresh foods from gardens   Diet Recall:  Time Intake  Breakfast Wheat bread and a slice of colby cheese, only consumed 50%.  Snack (AM) Protein smoothie with frozen fruit (half a cup), protein (1 scoop of Aldi brand protein powder), beet root, tsp agave, 1/2 tsp honey, pomegranate or white grape juice, Carnation Instant Breakfast.   Lunch .  Occasional mal[place] or ham sandwich (excluded from analysis because patient says that he only does this rarely)  Snack (PM)   Dinner Collard or turnip greens, rice. Baked chicken breast.   Snack (HS)    Estimated protein: 65 g Estimated fiber: 9 g  Not eating bread like he used to.   Food-Related History: Snacks:  Cucumber and poppy seed dressing. Bananas, apples, blueberries.  Beverages:  Drinking lots of water. Alcohol: None.  Dining Out:  Not eating fast food anymore after seeing an employee leave the bathroom without washing their hands.     Eating Behaviors: Not addressed at this time.   Physical Activity:  Going to gym 2-3 times per week.   Daily Estimated Nutritional Needs: Energy: 2489 kcals [Per [place] St-Jeor Equation (MSJ with sedentary AF and plan for 1 lb/week weight loss) using last recorded weight, 125.6 kg (01/04/24 1602)] Protein: 125 gm [ (20% kcal) using last recorded weight, 125.6 kg (01/04/24 1602)] Carbohydrate:   [no restriction] Fluid:   [per MD team] Fiber: 35 g/day  Nutrition Goals & Evaluation    Try to eat protein at least 3x daily to preserve muscle while losing weight (Ongoing, progressing)  Avoid drinking water for 2-3 hours prior to bed time to limit nighttime urination. (Ongoing, not a priority at this time given improved nocturnal urination) Limit intake of high-sodium foods (ongoing, progressing)  Nutrition goals reviewed, relevant barriers identified and addressed: none evident. He is evaluated to have good willingness and ability to achieve nutrition goals.   Nutrition Assessment     Ethan Rosales would benefit from interventions to improve protein and fiber intake, optimize carbohydrate quality, and support blood glucose control. Increasing protein can help maintain lean body mass and promote satiety during weight loss, which is important given his recent minor weight loss and discontinuation of Ozempic. Adding options like protein shakes,  lean meats, and including protein at each meal can help meet these needs. Enhancing fiber intake through whole fruits, flaxseeds or chia seeds, and other high-fiber foods supports digestive health, improves satiety, and aids in glycemic control. Replacing fruit juice with whole fruit reduces added sugars and slows glucose absorption. Monitoring portion sizes of carbohydrate-rich foods such as wheat bread, rice, and fruit juice, and pairing them with protein or healthy fats can help stabilize postprandial blood glucose and align with diabetes management goals. These strategies collectively address his current dietary gaps and support  both weight and glycemic targets.  Nutrition Intervention    - Nutrition Counseling: Motivational Interviewing Goal setting - Meals and Snacks  Nutrition Recommendations:  Consider increasing daily protein intake with options like an extra protein shake, an additional chicken breast during the day, or having a low-sodium malawi or ham sandwich more often. Try to include a source of protein at each meal and snack to help with satiety and muscle maintenance during weight loss. Try adding chia seeds or ground flaxseeds to smoothies for extra fiber and healthy fats. Replace fruit juice in smoothies with whole fruit to boost fiber and reduce rapid glucose spikes. Incorporate more high-fiber foods such as vegetables, berries, beans, lentils, or whole grains into meals and snacks. Aim for about 25-30 grams of fiber per day from a variety of sources to support blood sugar control and digestive health. Stay hydrated with water or unsweetened beverages to help manage appetite and support digestion. Monitor portion sizes of carbohydrate-rich foods such as wheat bread, rice, and fruit juice, and pair them with protein or healthy fats to help stabilize blood glucose.  Follow up will occur on October 20th at 3:15 pm  Food/Nutrition-related history, Anthropometric measurements,  Biochemical data, medical tests, procedures, and Nutrition-focused physical findings will be assessed at time of follow-up.    Recommendations for Clinical Team: No additional recommendations at this time.   Patient referred to outpatient nutrition services as part of therapy/treatment plans.  The patient reports they are physically located in [place]  and is currently: at home. I conducted a audio/video visit. I spent 14m 20s on the video call with the patient. I spent an additional 15 minutes on pre- and post-visit activities on the date of service.   I am located on-site and the patient is located off-site for this visit.     Dallas Nasuti, MS, RD, LDN, CNSC Registered Dietitian          [1] Past Medical History: Diagnosis Date  . Anxiety   . Arthritis   . Asthma (HHS-HCC)   . BPPV (benign paroxysmal positional vertigo) 08/09/2018  . Chronic abdominal pain   . Chronic back pain   . Chronic kidney failure, stage 3 (moderate) (CMS-HCC)   . Constipation   . Constipation 08/03/2019  . Depression   . Diabetes mellitus    (CMS-HCC)   . Diabetes mellitus type II, uncontrolled 06/18/2011  . Dribbling following urination 03/02/2018  . Erectile dysfunction 07/08/2019  . Esophageal motility disorder 05/12/2017  . GERD (gastroesophageal reflux disease)   . GERD (gastroesophageal reflux disease) 08/26/2016  . Glaucoma   . Headache(784.0)   . Hepatomegaly 06/24/2017  . Hypertension   . Hypertension 03/17/2016  . Hypoglycemia 12/21/2002  . Kidney disease, chronic, stage II (GFR 60-89 ml/min) 10/06/2013  . LUQ abdominal pain 05/07/2017  . MI (myocardial infarction)    (CMS-HCC) 2003  . Obesity 11/09/2014  . Obstructive sleep apnea 11/09/2014  . OSA on CPAP   . Other chronic pain 03/02/2018  . Overweight (BMI 25.0-29.9) 06/20/2017  . Pain of left sacroiliac joint 10/03/2016  . Paresthesias 03/02/2018  . Peptic ulceration   . Shoulder injury   . Sleep disturbance   .  Spinal stenosis of lumbosacral region 10/03/2016  . Stroke    (CMS-HCC) 2003   residual left sided weakness  . Type II diabetes mellitus    (CMS-HCC) 01/24/2012  [2] Current Outpatient Medications  Medication Sig Dispense Refill  . amlodipine (NORVASC) 5 MG tablet Take 1 tablet (  5 mg total) by mouth daily. 30 tablet 11  . ammonium lactate (AMLACTIN) 12 % cream Apply 1 Application topically two (2) times a day. 385 g 11  . carvedilol (COREG) 6.25 MG tablet Take 1 tablet (6.25 mg total) by mouth in the morning and 1 tablet (6.25 mg total) in the evening. Take with meals. 180 tablet 0  . cyclobenzaprine  (FEXMID ) 7.5 MG tablet Take 1 tablet (7.5 mg total) by mouth.    [name] ipratropium (ATROVENT) 21 mcg (0.03 %) nasal spray 1 spray into each nostril Three (3) times a day as needed for rhinitis. 30 mL 1  . linaclotide (LINZESS) 72 mcg capsule Take 1 capsule (72 mcg total) by mouth daily. 90 capsule 3  . losartan  (COZAAR ) 100 MG tablet Take 1 tablet (100 mg total) by mouth daily. 90 tablet 3  . meclizine (ANTIVERT) 12.5 mg tablet Take 1 tablet (12.5 mg total) by mouth Three (3) times a day as needed for dizziness or nausea. 30 tablet 1  . nitroglycerin  (NITROSTAT ) 0.4 MG SL tablet Place 1 tablet (0.4 mg total) under the tongue every five (5) minutes as needed for chest pain. Maximum of 3 doses in 15 minutes. 100 tablet 3  . omeprazole (PRILOSEC) 40 MG capsule Take 1 capsule (40 mg total) by mouth Two (2) times a day (30 minutes before a meal). 180 capsule 3  . rosuvastatin  (CRESTOR ) 20 MG tablet Take 1 tablet (20 mg total) by mouth nightly. 90 tablet 3  . tirzepatide (MOUNJARO) 2.5 mg/0.5 mL PnIj Inject 0.5 mL (2.5 mg total) under the skin every seven (7) days for 4 doses. 2 mL 0  . [START ON 02/01/2024] tirzepatide (MOUNJARO) 5 mg/0.5 mL PnIj pen Inject 0.5 mL (5 mg total) under the skin every seven (7) days for 4 doses. 2 mL 0  . travoprost 0.004 % Drop Administer 1 drop to both eyes every evening.      No current facility-administered medications for this visit.

## 2024-01-08 NOTE — Telephone Encounter (Signed)
 Copied from CRM #[id number]. Topic: Access To Clinicians - Medication Question >> Jan 08, 2024  1:57 PM Meghan P wrote: They need assistance with their medication(s). Patient is requesting the prescription for this medication to be sent over stating no substitutions. The reason for this is because the last time it was refilled the pharmacy filled it as the lotion and insurance will not cover that due to the lotion being over the counter. They will only cover the cream. Please contact pt for further details/clarification if needed.  Medication Name(s):  ammonium lactate (AMLACTIN) 12 % cream [[id number]]    Pharmacy: [facility] - [place], [place] - [address] [place], [place] [address] Phone: [phone]  Fax: [phone]   Please contact The patient by Cell Phone Telephone Information: Mobile          [phone]  in regards to this request.  Coverage: yes, coverage is accurate on file.  Urgent turnaround time: within 24 business hours. (Caller Notified)  Urgent Reason: Completely out of medication(s)

## 2024-01-11 NOTE — Telephone Encounter (Signed)
 It appears as thought only the lotion is brand name, not the cream.   Re-sent as DAW.

## 2024-01-11 NOTE — Telephone Encounter (Signed)
 Copied from CRM [id number]. Topic: Access To Clinicians - Medication Question >> Jan 11, 2024 12:00 PM Dena MATSU wrote: They need assistance with their medication(s). Alternate medication due to insurance/cost.  Medication Name(s): AMLACTIN 12% cream  AMLACTIN 12% cream is covered by insurance per pharmacy. Ethan Rosales [phone]. The lotion that was placed is not. Pharmacy needs to know if they can reactivate the cream or if there was a specific reason for this being changed to the lotion.   Pharmacy: [facility] - [place], [place] - [address]   Please contact Ethan Rosales with [facility] by [phone] in regards to this request.  Coverage: yes, coverage is accurate on file.  Medication request turnaround time: 72 business hours. Programmer, systems Notified)

## 2024-01-23 ENCOUNTER — Emergency Department (HOSPITAL_BASED_OUTPATIENT_CLINIC_OR_DEPARTMENT_OTHER)

## 2024-01-23 ENCOUNTER — Other Ambulatory Visit: Payer: Self-pay

## 2024-01-23 ENCOUNTER — Encounter (HOSPITAL_BASED_OUTPATIENT_CLINIC_OR_DEPARTMENT_OTHER): Payer: Self-pay

## 2024-01-23 ENCOUNTER — Emergency Department (HOSPITAL_BASED_OUTPATIENT_CLINIC_OR_DEPARTMENT_OTHER)
Admission: EM | Admit: 2024-01-23 | Discharge: 2024-01-23 | Discharge status: Against Medical Advice | Attending: Emergency Medicine | Admitting: Emergency Medicine

## 2024-01-23 ENCOUNTER — Ambulatory Visit (HOSPITAL_COMMUNITY)

## 2024-01-23 DIAGNOSIS — Z5329 Procedure and treatment not carried out because of patient's decision for other reasons: Secondary | ICD-10-CM | POA: Diagnosis not present

## 2024-01-23 DIAGNOSIS — Z79899 Other long term (current) drug therapy: Secondary | ICD-10-CM | POA: Diagnosis not present

## 2024-01-23 DIAGNOSIS — M545 Low back pain, unspecified: Secondary | ICD-10-CM | POA: Insufficient documentation

## 2024-01-23 DIAGNOSIS — I10 Essential (primary) hypertension: Secondary | ICD-10-CM | POA: Insufficient documentation

## 2024-01-23 DIAGNOSIS — M5441 Lumbago with sciatica, right side: Secondary | ICD-10-CM

## 2024-01-23 LAB — CBC WITH DIFFERENTIAL/PLATELET
Abs Immature Granulocytes: 0.02 K/uL (ref 0.00–0.07)
Basophils Absolute: 0 K/uL (ref 0.0–0.1)
Basophils Relative: 0 %
Eosinophils Absolute: 0.1 K/uL (ref 0.0–0.5)
Eosinophils Relative: 1 %
HCT: 39.1 % (ref 39.0–52.0)
Hemoglobin: 13.7 g/dL (ref 13.0–17.0)
Immature Granulocytes: 0 %
Lymphocytes Relative: 23 %
Lymphs Abs: 2 K/uL (ref 0.7–4.0)
MCH: 29 pg (ref 26.0–34.0)
MCHC: 35 g/dL (ref 30.0–36.0)
MCV: 82.8 fL (ref 80.0–100.0)
Monocytes Absolute: 0.6 K/uL (ref 0.1–1.0)
Monocytes Relative: 6 %
Neutro Abs: 6.3 K/uL (ref 1.7–7.7)
Neutrophils Relative %: 70 %
Platelets: 227 K/uL (ref 150–400)
RBC: 4.72 MIL/uL (ref 4.22–5.81)
RDW: 13 % (ref 11.5–15.5)
WBC: 9 K/uL (ref 4.0–10.5)
nRBC: 0 % (ref 0.0–0.2)

## 2024-01-23 LAB — URINALYSIS, ROUTINE W REFLEX MICROSCOPIC
Bilirubin Urine: NEGATIVE
Glucose, UA: NEGATIVE mg/dL
Hgb urine dipstick: NEGATIVE
Ketones, ur: NEGATIVE mg/dL
Leukocytes,Ua: NEGATIVE
Nitrite: NEGATIVE
Protein, ur: NEGATIVE mg/dL
Specific Gravity, Urine: 1.01 (ref 1.005–1.030)
pH: 7 (ref 5.0–8.0)

## 2024-01-23 LAB — BASIC METABOLIC PANEL WITH GFR
Anion gap: 12 (ref 5–15)
BUN: 11 mg/dL (ref 8–23)
CO2: 23 mmol/L (ref 22–32)
Calcium: 9.5 mg/dL (ref 8.9–10.3)
Chloride: 104 mmol/L (ref 98–111)
Creatinine, Ser: 1.16 mg/dL (ref 0.61–1.24)
GFR, Estimated: >60 mL/min (ref >60)
Glucose, Bld: 168 mg/dL — ABNORMAL HIGH (ref 70–99)
Potassium: 3.7 mmol/L (ref 3.5–5.1)
Sodium: 138 mmol/L (ref 135–145)

## 2024-01-23 LAB — TROPONIN T, HIGH SENSITIVITY: Troponin T High Sensitivity: <15 ng/L (ref 0–19)

## 2024-01-23 MED ORDER — FENTANYL CITRATE PF 50 MCG/ML IJ SOSY
50.0000 ug | PREFILLED_SYRINGE | Freq: Once | INTRAMUSCULAR | Status: AC
  Administered 2024-01-23: 50 ug via INTRAVENOUS
  Filled 2024-01-23: qty 1
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
## 2024-01-23 NOTE — ED Notes (Signed)
 Pt stated he was leaving.

## 2024-01-23 NOTE — ED Provider Notes (Signed)
 [facility]  ED Provider Note  Ethan Rosales 63 y.o. male DOB: 1961-01-24 MRN: [id number] History   Chief Complaint  Patient presents with  . Back Pain    Pt reports low back pain radiating into bilateral legs. Denies bowel or bladder incontinence, traumatic injuries or anesthesia. [facility] got [facility] medication and [facility]    HPI History of Present Illness This is a male with a history of chronic back pain presenting with acute back pain. He is accompanied by his sister.  The patient reports that the acute back pain began 3 days ago after he woke up at 2:30 AM to urinate. He does not recall any specific incident that might have triggered the pain. The pain is severe and located in the lower back, radiating from his buttocks to both legs, extending into the calves. He has been experiencing frequent urination, having used the bathroom approximately 7 times since the onset of the pain, despite not consuming any food or drink yesterday. He received an injection for his chronic back pain yesterday, but the current pain started before this treatment. His last MRI scan was conducted in 11/2022. He has tried gabapentin  and Flexeril  for his back pain, but these medications have not provided relief. He has a history of SI joint fusion.  He has a history of high blood pressure, which is currently under control.  PAST SURGICAL HISTORY: SI joint fusion    Past Medical History:  Diagnosis Date  . Diabetes mellitus Type 2 01/23/2024    No past surgical history on file.  Social History   Substance and Sexual Activity  Alcohol Use None   Tobacco Use History[1] E-Cigarettes  . Vaping Use    . Start Date    . Cartridges/Day    . Quit Date     Social History   Substance and Sexual Activity  Drug Use Not on file         Allergies[2]  Home Medications   LOVASTATIN  PO    Take by mouth.   METAXALONE  (SKELAXIN) 800 MG TABLET    Take 1 tablet (800 mg total) by mouth at bedtime as needed for Pain.    Primary Survey   Exposure Lumbar Spinal TendernessNo Thoracic Spine Tenderness   No visible abdominal trauma.       Review of Systems   Review of Systems  Constitutional:  Negative for chills and fever.  HENT:  Negative for ear pain and sore throat.   Eyes:  Negative for pain and visual disturbance.  Respiratory:  Negative for cough and shortness of breath.   Cardiovascular:  Negative for chest pain and palpitations.  Gastrointestinal:  Negative for abdominal pain and vomiting.  Genitourinary:  Positive for frequency. Negative for dysuria and hematuria.  Musculoskeletal:  Positive for back pain. Negative for arthralgias.  Skin:  Negative for color change and rash.  Neurological:  Negative for seizures and syncope.  All other systems reviewed and are negative.   Physical Exam   ED Triage Vitals [01/23/24 0800]  BP (!) 160/88  Heart Rate 69  Resp 18  SpO2 100 %  Temp 97.8 F (36.6 C)    Physical Exam  Nursing note and vitals reviewed. Constitutional: He appears well-developed and well-nourished. He appears to be in pain, does not appear distressed, does not appear ill and no respiratory distress. Not diaphoretic. Uncomfortable appearing, non toxic, no acute distress,  no respiratory distress   HENT:  Head: Normocephalic and atraumatic.  Right Ear: Normal external ear. Normal ear canal.  Left Ear: Normal external ear. Normal ear canal.  Nose: Nose normal.  Mouth/Throat: Voice normal.  Eyes: Pupils are equal, round, and reactive to light. Right eye: no conjunctival injection. Left eye: no conjunctival injection.  Neck: Normal range of motion and voice normal. Normal range of motion.  Cardiovascular: Normal rate, regular rhythm, normal heart sounds and intact distal pulses.  No audible murmur.  Pulmonary/Chest: No respiratory distress. Not tachypneic. Respiratory  effort normal and breath sounds normal.  Abdominal: Soft. There is no abdominal tenderness. Abdomen not distended. No visible abdominal trauma.  Musculoskeletal: No T spine tenderness and L spine tenderness. Normal range of motion. No obvious deformity noted to extremities.     Cervical back: Normal range of motion. Normal range of motion.     Comments: He has diffuse lumbar and midline spinal tenderness on exam.  No thoracic spinal tenderness.  No overlying skin changes.  No swelling or deformity present.  5 out of 5 strength of his bilateral lower extremities Sensation to light touch intact of his bilateral lower extremities   Neurological: He is alert and oriented to person, place, and time. Cranial nerves intact II through XII. Sensation intact to light touch, bilateral upper and lower extremities. Strength 5/5 bilateral upper and lower extremities.  Skin: Skin is warm. Not diaphoretic. Skin is dry.  Psychiatric: He has a normal mood and affect. His behavior is normal.   Physical Exam   ED Course   Lab results:   CBC AND DIFFERENTIAL - Abnormal      Result Value   WBC 8.2     RBC 4.55 (*)    HGB 13.3 (*)    HCT 37.9 (*)    MCV 83.3     MCH 29.2     MCHC 35.1     Plt Ct 221     RDW SD 39.4     MPV 10.4     NRBC% 0.0     Absolute NRBC Count 0.00     NEUTROPHIL % 67.4     LYMPHOCYTE % 26.3     MONOCYTE % 5.6     Eosinophil % 0.4     BASOPHIL % 0.2     IG% 0.1     ABSOLUTE NEUTROPHIL COUNT 5.49     ABSOLUTE LYMPHOCYTE COUNT 2.15     Absolute Monocyte Count 0.46     Absolute Eosinophil Count 0.03     Absolute Basophil Count 0.02     Absolute Immature Granulocyte Count 0.01    BASIC METABOLIC PANEL - Abnormal   Na 135 (*)    Potassium 3.8     Cl 100     CO2 23     AGAP 12     Glucose 205 (*)    BUN 11     Creatinine 1.17     Ca 9.6     BUN/CREAT RATIO 9.4 (*)    eGFR 71     Comment: Normal GFR (glomerular filtration rate) > 60 mL/min/1.73 meters squared, < 60  may include impaired kidney function. Calculation based on the Chronic Kidney Disease Epidemiology Collaboration (CK-EPI)equation refit without adjustment for race.  URINALYSIS W/MICRO REFLEX CULTURE - SYMPTOMATIC - Abnormal   Urine Color Yellow     Urine Clarity Clear     Urine Specific Gravity 1.014     Urine pH 6.5  Urine Protein - Dipstick Negative     Urine Glucose Negative     Urine Ketones Negative     Urine Bilirubin Negative     Urine Blood Negative     Urine Nitrite Negative     Urine Urobilinogen 3 (*)    Urine Leukocyte Esterase Negative     UA Microscopic No Micro     Narrative:    Does not meet criteria for reflex to Urine Culture.  HEMOGLOBIN A1C - Abnormal   Hemoglobin A1c 8.0 (*)    Narrative:    Reference Interval:                  4.8 - 5.6% Increased Risk for Diabetes:         5.7 - 6.4% Diabetes:                             >=6.5% Glycemic Control for Adults with Diabetes:                        <7.0%   POCT GLUCOSE - Abnormal   Glucose, POC 201 (*)    OPERATOR ID [id number]     INSTRUMENT ID [id number]    POCT ISTAT VENOUS PANEL   POC CREAT 1.3     SAMPLE SOURCE VENOUS     INSTRUMENT ID [id number]     OPERATOR ID [id number]    POCT ISTAT CREATININE    Imaging:   MRI SPINE LUMBAR WO W CONTRAST   Narrative:    CLINICAL HISTORY: Concern for epidural compression syndrome. Back pain radiating to both legs.   COMPARISON: 01/12/2023 lumbar spine MRI  TECHNIQUE: Multiplanar multisequence MR imaging of the lumbar spine was performed with and without contrast. 10 mL GADOBUTROL 1 MMOL/ML IV SOLN IV was administered.  FINDINGS: There is subtle straightening of the lumbar lordotic curvature without spondylolisthesis. The facets are anatomically aligned.   When accounting for degenerative changes, the vertebral body heights are grossly maintained. There is no suspicious focal bone marrow signal or enhancement.  There is mild multilevel intervertebral disc  height loss.  The imaged spinal cord is normal in signal and volume. The conus medullaris terminates at approximately T12-L1. There is no unexpected clumping or enhancement of the spinal nerve roots.   The paraspinal soft tissues are unremarkable. Partially imaged left sacroiliac fusion.  Level by level:  As a general note, the lateral recesses of the spinal canal contain descending nerve roots.  T12-L1: No significant disc bulge or facet arthropathy.  Spinal Canal: Patent  Right foramen: Patent Left foramen: Patent  L1-L2: No significant disc bulge or facet arthropathy.  Spinal Canal: Patent  Right foramen: Patent Left foramen: Patent  L2-L3: No significant disc bulge or facet arthropathy.  Spinal Canal: Patent  Right foramen: Patent Left foramen: Patent  L3-L4: No significant disc bulge or facet arthropathy.  Spinal Canal: Patent  Right foramen: Patent Left foramen: Patent  L4-L5: Broad-based disc bulge, ligamentum flavum thickening and facet arthropathy  Spinal Canal: Moderately stenotic with narrowing of lateral recesses  Right foramen: Moderately stenotic Left foramen: Moderately stenotic  L5-S1: Facet arthropathy  Spinal Canal: Patent  Right foramen: Patent Left foramen: Patent    Impression:    IMPRESSION: 1.  Varying degrees of multilevel spondylosis as above. In particular, spinal canal stenosis is moderate L4-L5. Neural foraminal stenosis as above.  Electronically Signed by: Maude Angst on  01/23/2024 2:08 PM     ECG: ECG Results   None                                                                        Pre-Sedation Procedures    Medical Decision Making I reviewed diagnosis with the patient. All questions answered. Patient is comfortable with the plan to go home and follow up as an outpatient.  Patient remains hemodynamically stable and ready for discharge.  Strict return precautions were discussed and given  in writing.   Amount and/or Complexity of Data Reviewed Labs: ordered. Radiology: ordered.  Risk Prescription drug management.   Results   Assessment & Plan      Provider Communication  New Prescriptions   No medications on file    Modified Medications   No medications on file    Discontinued Medications   No medications on file    Clinical Impression Final diagnoses:  Lumbar back pain  Urinary frequency  Hyperglycemia    ED Disposition     ED Disposition  Discharge   Condition  Stable   Comment  --                 Follow-up Information     Schedule an appointment as soon as possible for a visit  with Rubye LITTIE Cap, MD.   Specialty: Family Medicine Contact information: [address] [phone] [facility] [place] [address] [phone]                  Electronically signed by:       [1] Social History Tobacco Use  Smoking Status Not on file  Smokeless Tobacco Not on file  [2] Allergies Allergen Reactions  . Morphine Anaphylaxis  . Pcn [Penicillins] Hives   Taylor J Day, DO 01/23/24 1423

## 2024-01-23 NOTE — ED Notes (Signed)
 Called Charge nurse Vernell to make aware patient will be traveling POV by sister with secure IV. Update given. Patient is in route.

## 2024-01-23 NOTE — ED Notes (Signed)
 Pt arrived via POV with family at side. Zero noted distress. V/S WNL. MRI called to alert them, pt has arrived.

## 2024-01-23 NOTE — ED Triage Notes (Signed)
 Patient here POV from Home.  Back Pain for a few months now. Had injections to Lower Back for same recently but pain became severe tonight (was at rest) and also noted SOB during this time as well. No Known fevers. No Dysuria. Bilateral leg pain. Some Nausea earlier that subsided.  NAD noted during triage. A&Ox4. GCS 15. BIB Wheelchair.

## 2024-02-29 ENCOUNTER — Encounter: Payer: Self-pay | Admitting: Radiology
